# Patient Record
Sex: Female | Born: 1985 | Race: White | Hispanic: No | Marital: Married | Smoking: Former smoker
Health system: Western US, Academic
[De-identification: ages and names within clinical notes are randomized; demographics above are authoritative.]

## PROBLEM LIST (undated history)

## (undated) MED ORDER — MEPERIDINE HCL 25 MG/ML IJ SOLN
12.50 mg | INTRAMUSCULAR | Status: AC | PRN
Start: 2016-06-09 — End: ?

## (undated) MED ORDER — DIPHENHYDRAMINE HCL 50 MG/ML IJ SOLN
25.00 mg | Freq: Four times a day (QID) | INTRAMUSCULAR | Status: AC | PRN
Start: 2016-06-09 — End: ?

## (undated) MED ORDER — ONDANSETRON HCL 4 MG/2ML IV SOLN
4.00 mg | INTRAMUSCULAR | Status: AC | PRN
Start: 2016-06-09 — End: ?

## (undated) MED ORDER — KETOROLAC TROMETHAMINE 30 MG/ML IJ SOLN
30.00 mg | Freq: Once | INTRAMUSCULAR | Status: AC | PRN
Start: 2016-06-09 — End: 2016-06-10

---

## 2010-01-29 IMAGING — US US PELVIS COMPLETE MODIFY
1 series · 14 of 25 positions shown · non-contrast
Comparison: None

CLINICAL DATA: Dysfunctional uterine bleeding with left lower
quadrant pain.  LMP 07/16/2007

TRANSVAGINAL ULTRASOUND OF PELVIS,ULTRASOUND PELVIS COMPLETE -
MODIFY
TECHNIQUE: Transvaginal ultrasound examination of the pelvis was
performed including evaluation of the uterus, ovaries, adnexal
regions, and pelvic cul-de-sac,

[Series 1: us transvaginal non-ob · 14 of 38 slices shown]
[im 1/38]
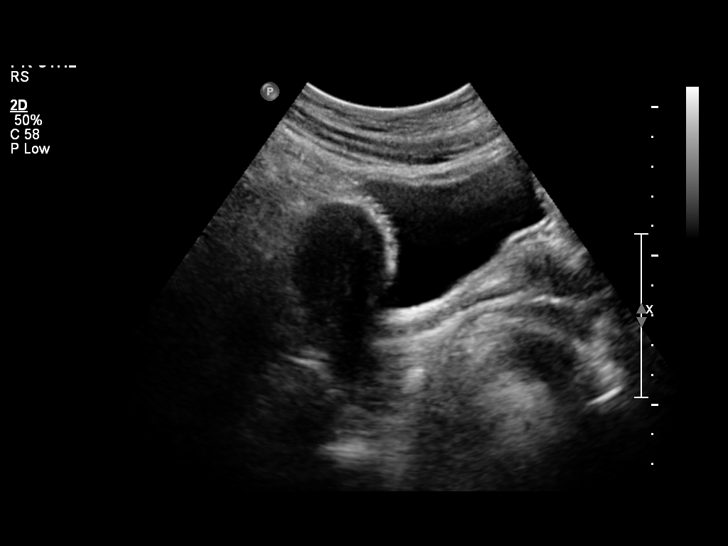
[im 4/38]
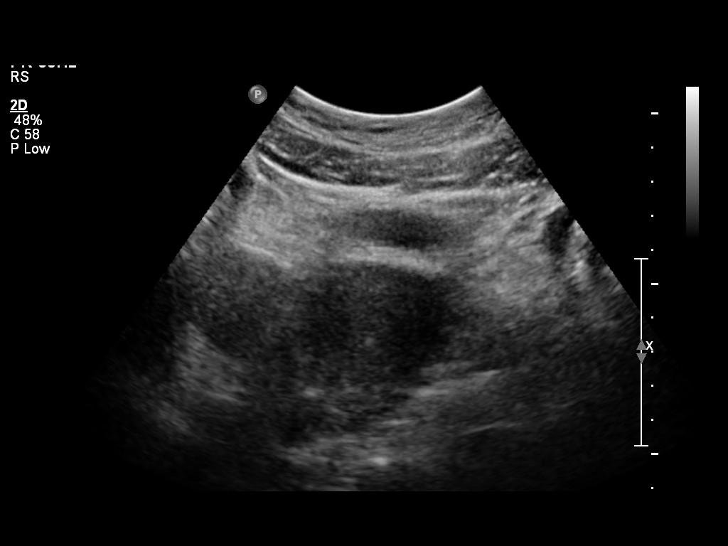
[im 7/38]
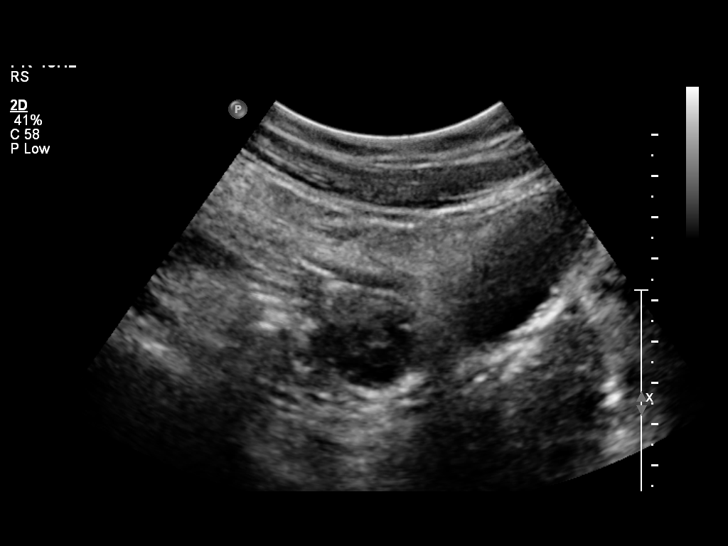
[im 10/38]
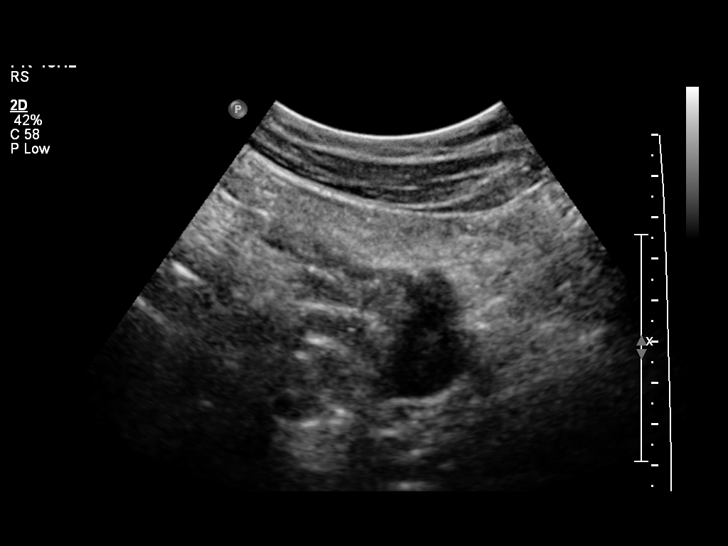
[im 13/38]
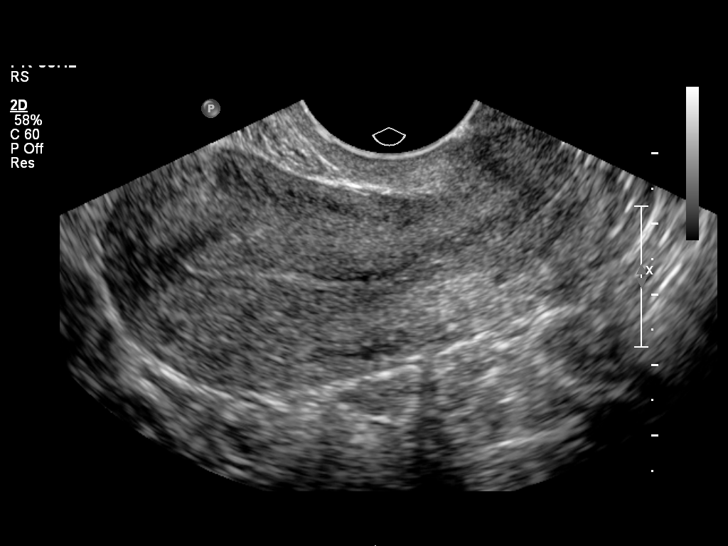
[im 14/38]
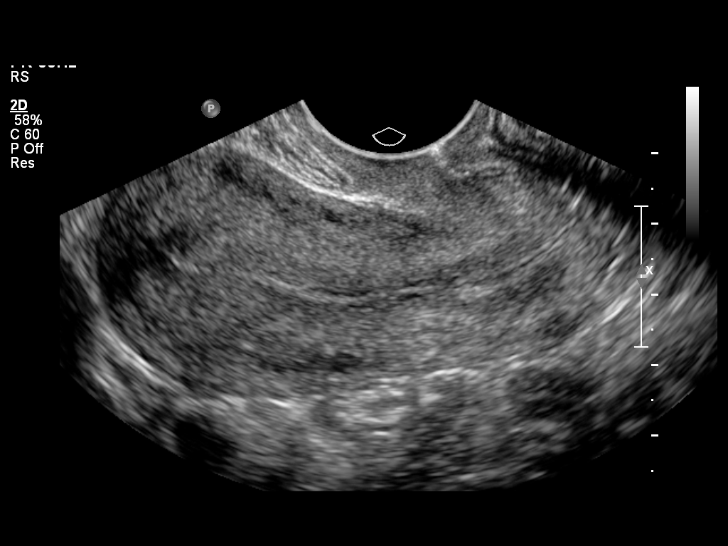
[im 17/38]
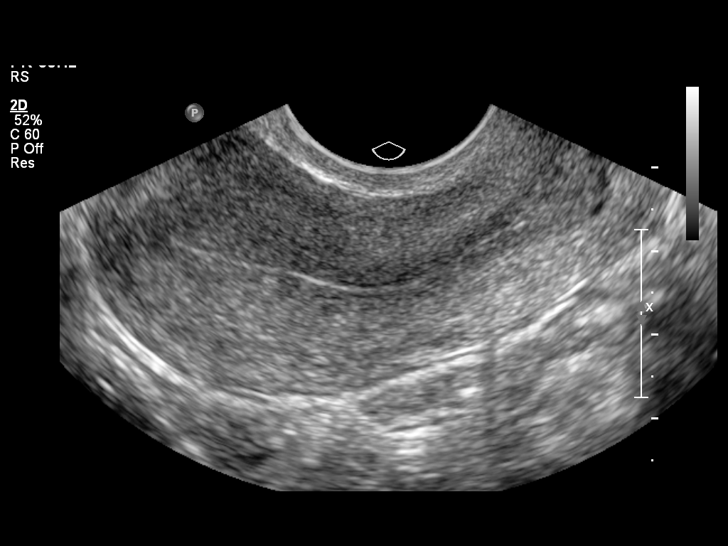
[im 21/38]
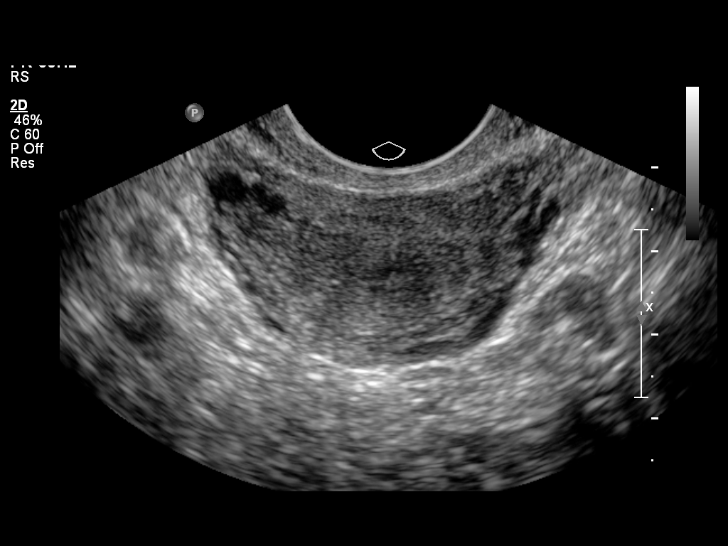
[im 24/38]
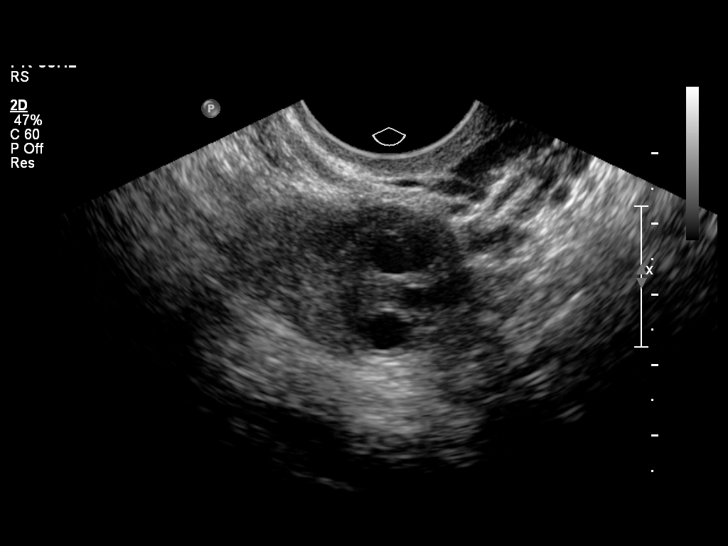
[im 25/38]
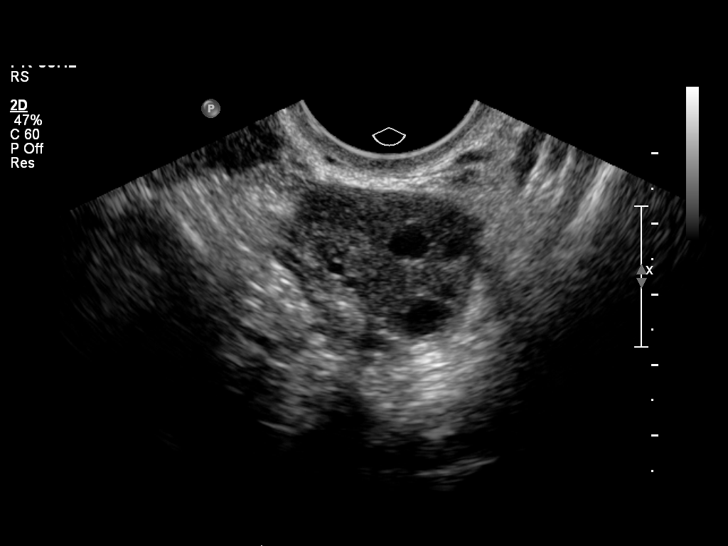
[im 28/38]
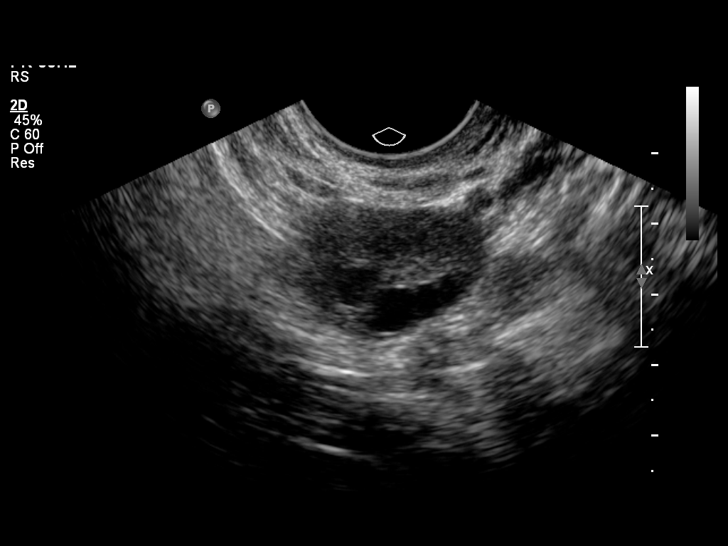
[im 31/38]
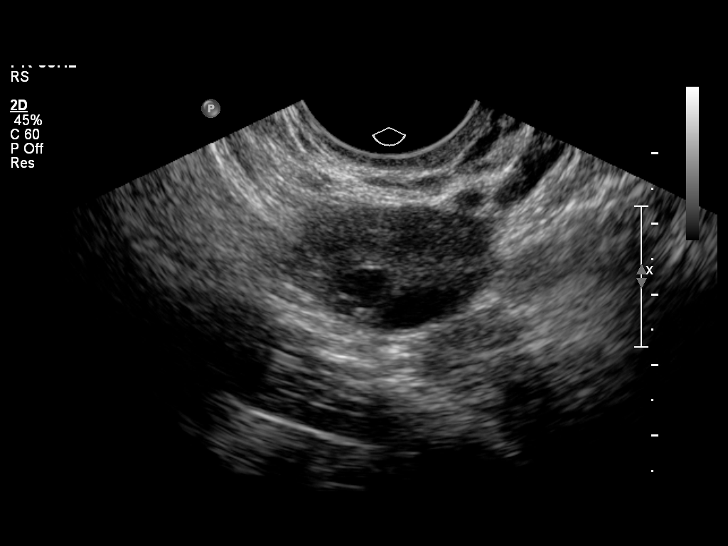
[im 34/38]
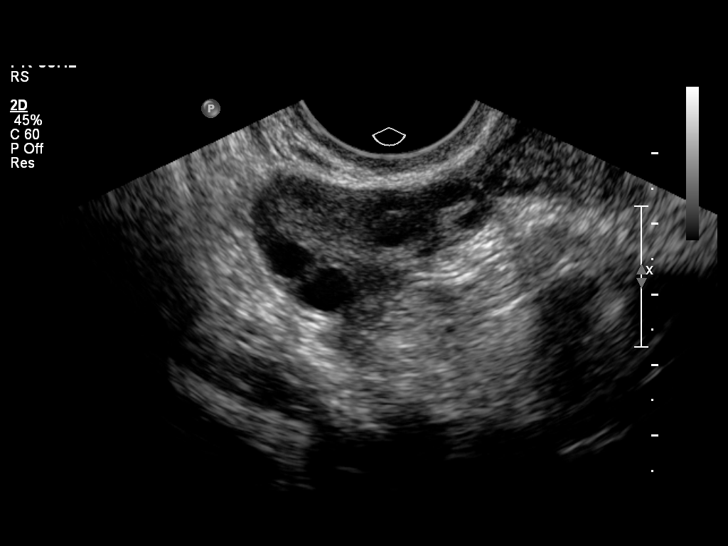
[im 38/38]
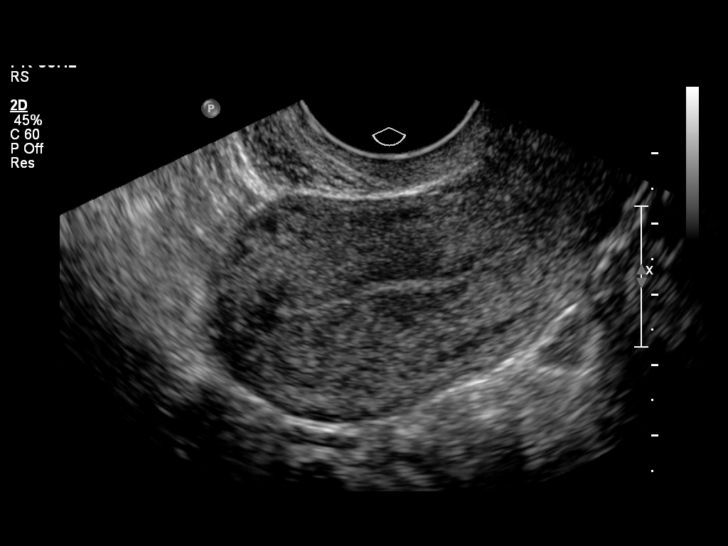

[14 of 25 positions shown; findings below may reference images not displayed]

FINDINGS: The uterus demonstrates a sagittal length of 3.8 cm, an
AP width of 3.6 cm and a transverse width of 4.9 cm.  A homogeneous
uterine myometrium is seen.  The endometrial canal is thin and
echogenic with an AP width of 3 mm.  No areas of focal thickening
or inhomogeneity are noted.

Both ovaries are seen and have a normal appearance with the left
ovary measuring 3.4 x 2.4 x 2.5 cm and the right ovary measuring
3.9 x 2.2 x 3.4 cm.  Both ovaries contain several subcentimeter
follicles.
IMPRESSION: Normal uterine myometrium, endometrium and ovaries.  The
endometrium is thin  and this does not correspond with the
patient's given LMP of 07/16/2007, but has a post secretary phase
and nonstimulated appearance. Clinical correlation is recommended.

## 2013-09-12 IMAGING — US US RENAL KIDNEY
1 series · 17 of 25 positions shown · non-contrast
Comparison: none

REASON FOR EXAM: R flank pain and vomiting
COMMENTS:

[Series 1: us renal kidney · 17 of 67 slices shown]
[im 1/67]
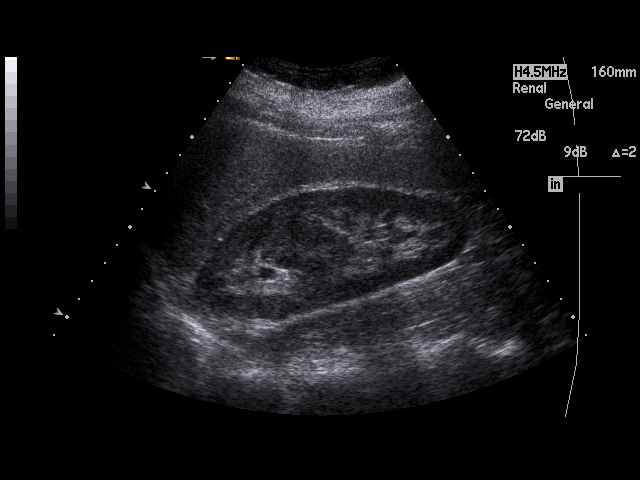
[im 6/67]
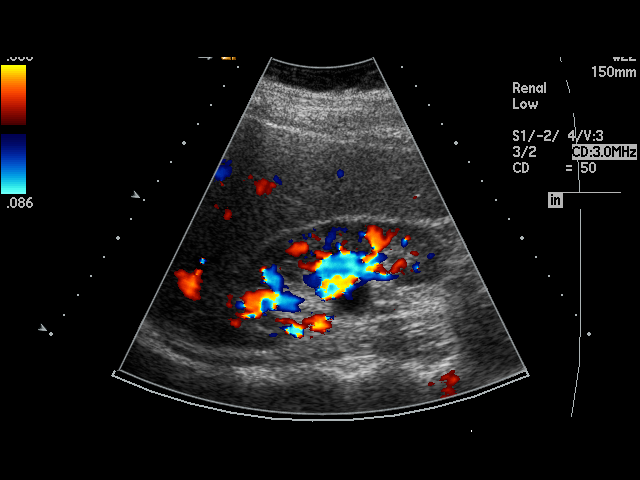
[im 9/67]
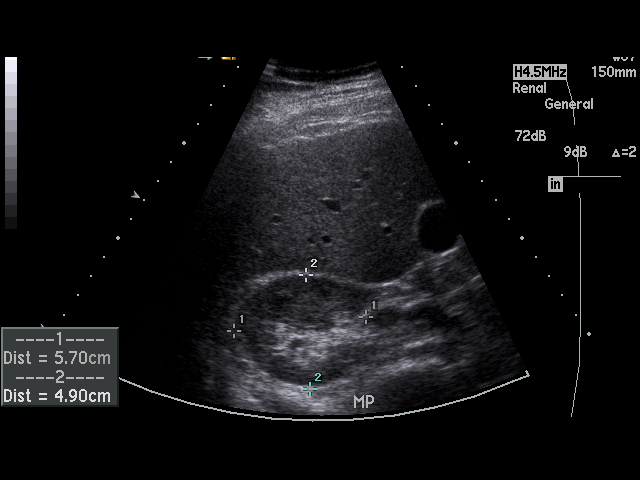
[im 14/67]
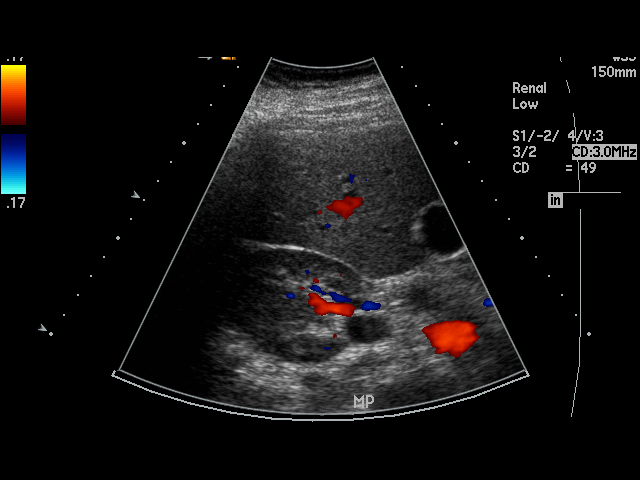
[im 17/67]
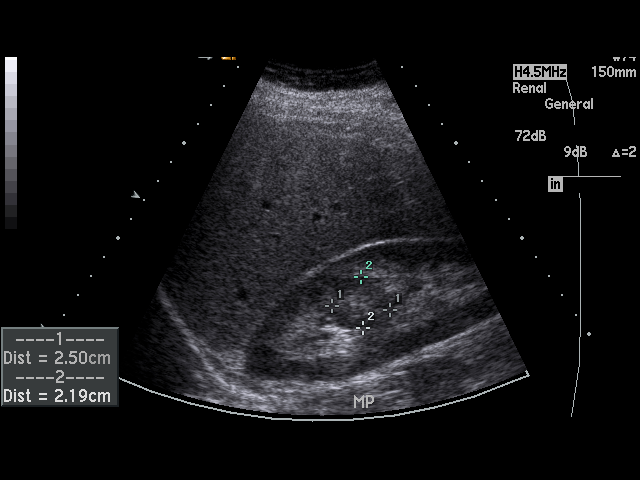
[im 23/67]
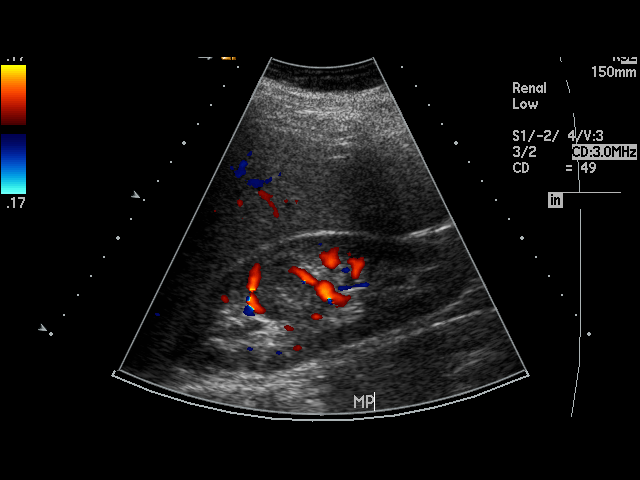
[im 25/67]
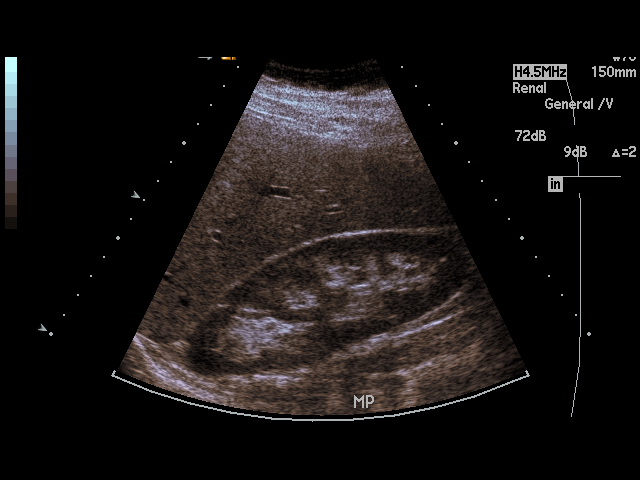
[im 31/67]
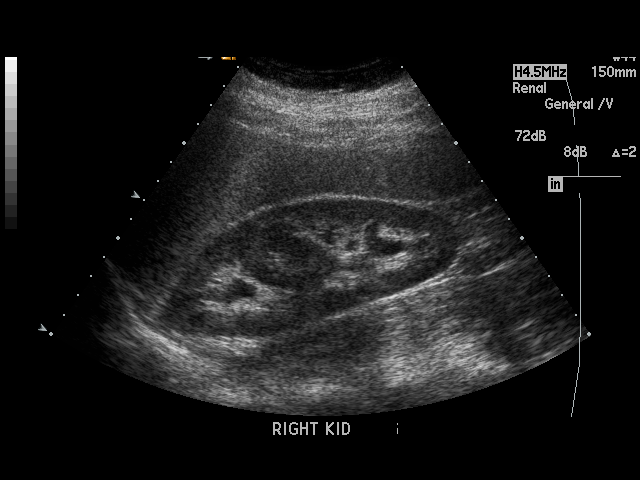
[im 34/67]
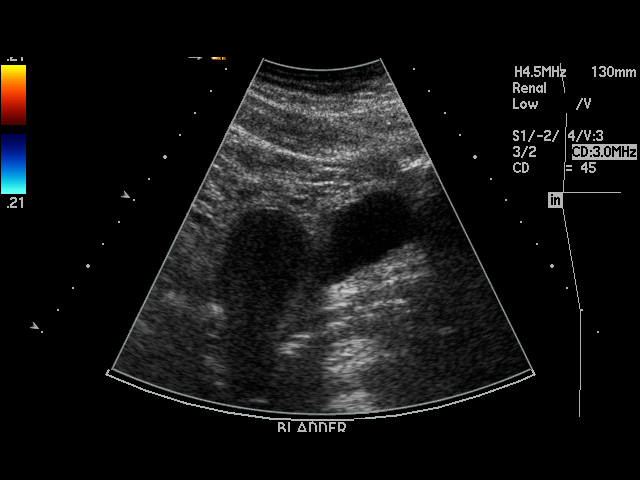
[im 36/67]
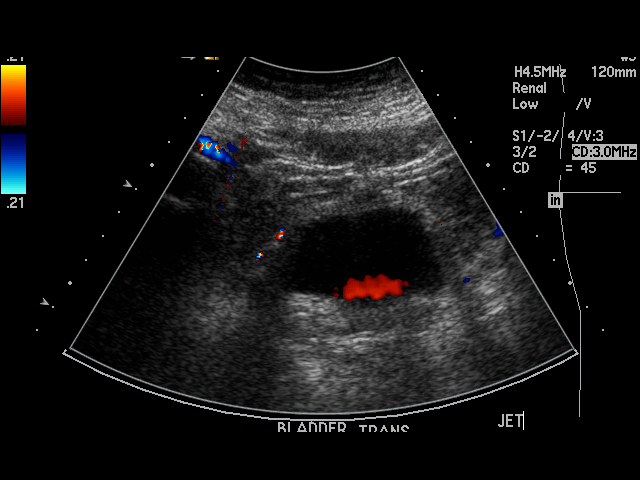
[im 42/67]
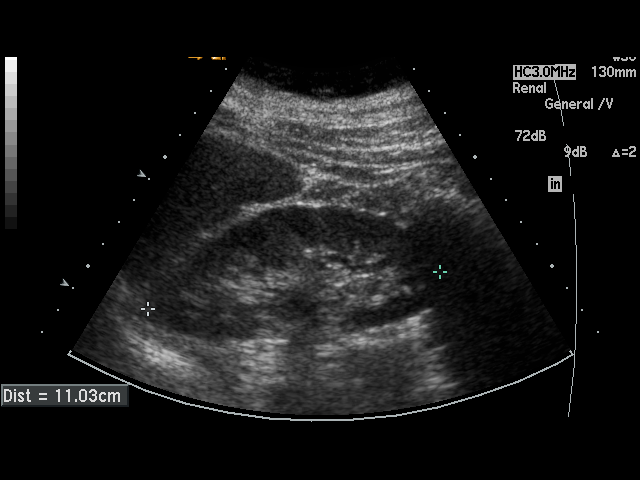
[im 45/67]
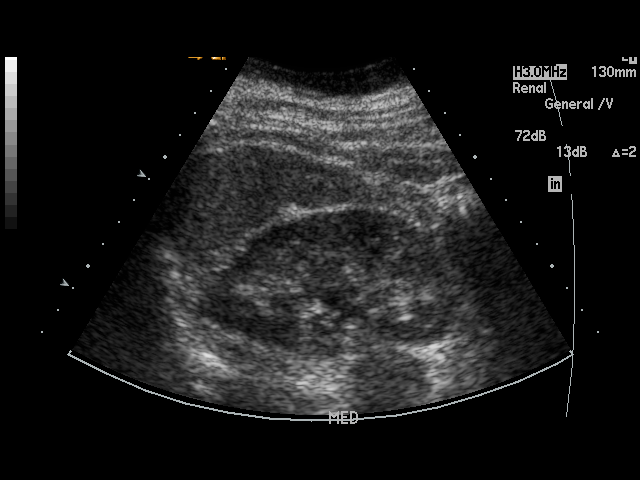
[im 50/67]
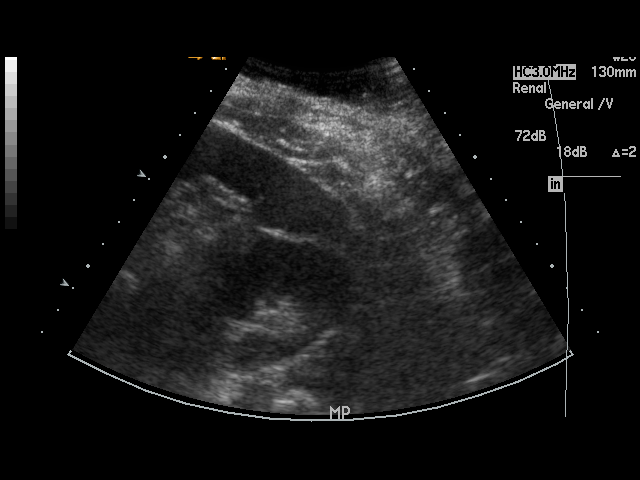
[im 53/67]
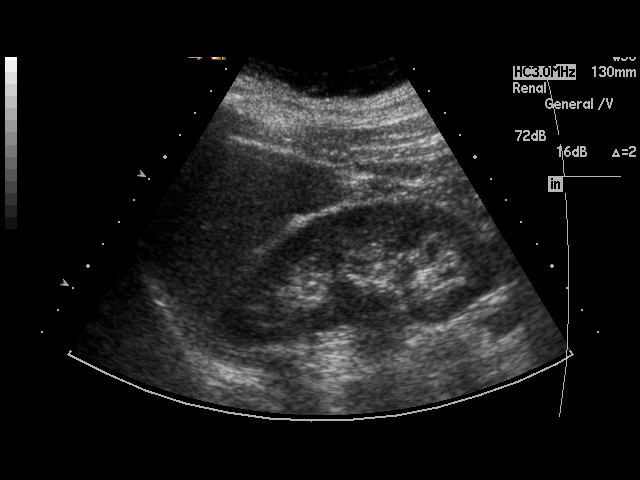
[im 58/67]
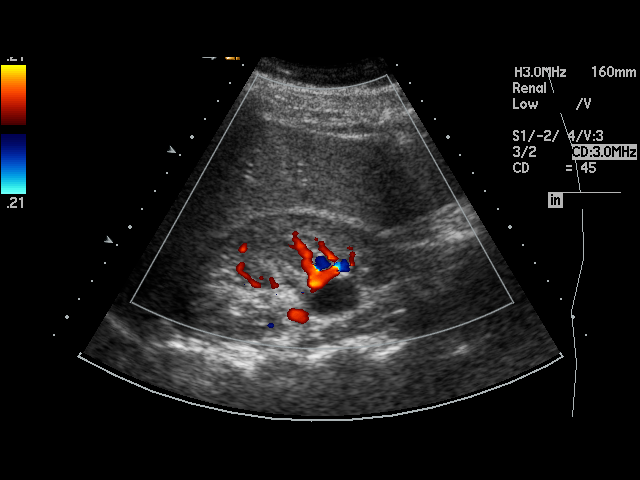
[im 61/67]
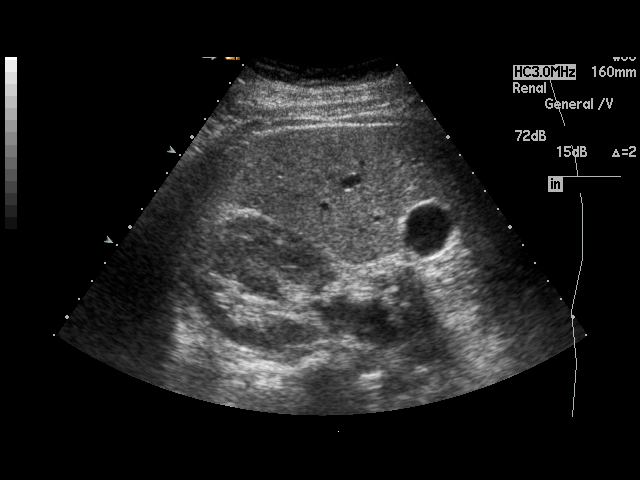
[im 67/67]
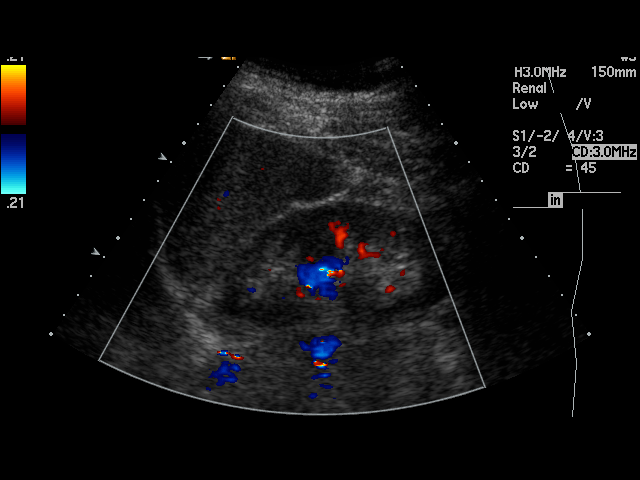

[17 of 25 positions shown; findings below may reference images not displayed]

PROCEDURE:     US  - US KIDNEY  - April 08, 2011  [DATE]

RESULT:     The right kidney measures 13 x 5.7 x 4.9 cm. The left kidney
measures 11 x 4.8 x 4.7 cm. Minimal hydronephrosis is present on the right.
A questionable hypoechoic mass versus column of Bertin on the right is
present measuring 2.9 cm in greatest dimension. The left kidney exhibits no
hydronephrosis. Bilateral ureteral jets are demonstrated. No perinephric
fluid collections are demonstrated.
IMPRESSION: 1. There is minimal hydronephrosis on the right yet a ureteral jet on the
right is demonstrated.
2. There is a hypoechoic focus in the midpole of the right kidney most
compatible with a column of Bertin though a mass cannot be dogmatically
excluded. No calcified stones are demonstrated within either kidney.
3. The left kidney is normal in appearance and a left ureteral jet is
demonstrated.

## 2015-11-13 ENCOUNTER — Ambulatory Visit: Payer: Self-pay

## 2015-11-14 ENCOUNTER — Ambulatory Visit: Payer: Self-pay | Admitting: Ob/Gyn

## 2015-11-26 ENCOUNTER — Ambulatory Visit: Payer: Self-pay | Admitting: Ob/Gyn

## 2015-11-26 LAB — URINALYSIS 8, POINT OF CARE TESTING SOM (LIO)
Glucose, UA POCT: NEGATIVE mg/dL
Leukocytes, UA POCT: NEGATIVE
Nitrite, UA POCT: NEGATIVE
Protein, UA POCT: NEGATIVE mg/dL
Specific Gravity, UA (POCT): >=1.03 (ref 1.003–1.030)
pH, UA POCT: 5.5 (ref 5.0–8.0)

## 2015-11-26 LAB — CBC WITH DIFF, BLOOD
ANC automated: 7.1 10*3/uL (ref 2.0–8.1)
Basophils %: 0.4 %
Basophils Absolute: 0 10*3/uL (ref 0.0–0.2)
Eosinophils %: 0.4 %
Eosinophils Absolute: 0 10*3/uL (ref 0.0–0.5)
Hematocrit: 40.4 % (ref 34.0–44.0)
Hgb: 13.5 G/DL (ref 11.5–15.0)
Lymphocytes %: 20 %
Lymphocytes Absolute: 1.9 10*3/uL (ref 0.9–3.3)
MCH: 28.9 PG (ref 27.0–33.5)
MCHC: 33.5 G/DL (ref 32.0–35.5)
MCV: 86.4 FL (ref 81.5–97.0)
MPV: 8.4 FL (ref 7.2–11.7)
Monocytes %: 6 %
Monocytes Absolute: 0.6 10*3/uL (ref 0.0–0.8)
Neutrophils % (A): 73.2 %
PLT Count: 279 10*3/uL (ref 150–400)
RBC: 4.68 10*6/uL (ref 3.70–5.00)
RDW-CV: 12.6 % (ref 11.6–14.4)
White Bld Cell Count: 9.6 10*3/uL (ref 4.0–10.5)

## 2015-11-26 LAB — HIV 1/2 ANTIBODY & P24 ANTIGEN ASSAY, BLOOD: HIV  1+2 Ab plus HIV 1 p24 Ant Scrn: NONREACTIVE

## 2015-11-26 LAB — URINALYSIS
Bilirubin, UA: NEGATIVE
Glucose, UA: NEGATIVE MG/DL
Hemoglobin, UA: NEGATIVE
Ketones, UA: NEGATIVE MG/DL
Leukocyte Esterase, UA: NEGATIVE
Nitrite, UA: NEGATIVE
Protein, UA: NEGATIVE MG/DL
RBC, UA: 2 #/HPF (ref 0–3)
Specific Grav, UA: 1.029 (ref 1.003–1.030)
Squamous Epithelial, UA: <1 /HPF (ref 0–10)
Urobilinogen, UA: <2 MG/DL (ref <2.0)
WBC, UA: <1 #/HPF (ref 0–5)
pH, UA: 5 (ref 5.0–8.0)

## 2015-11-26 LAB — HEPATITIS B SURFACE ANTIGEN WITH CONFIRMATION: Hepatitis B Surface Antigen: NONREACTIVE

## 2015-11-26 LAB — ABO/RH(D) + ANTIBODY SCREEN
ABO/Rh(D): O NEG
Antibody Screen Result: NEGATIVE

## 2015-11-26 LAB — RUBELLA IGG ANTIBODY, BLOOD: Rubella Virus  Ab IgG: 4

## 2015-11-26 LAB — LABCUM (HISTORIC)

## 2015-11-26 LAB — SYPHILIS EIA SCREEN, BLOOD: T. Pallidum Ab: NONREACTIVE

## 2015-11-27 LAB — URINE CULTURE
Culture Result: 1
Culture Result: 10000

## 2015-12-05 ENCOUNTER — Ambulatory Visit: Payer: Self-pay | Admitting: Ob/Gyn

## 2015-12-17 ENCOUNTER — Ambulatory Visit: Payer: Self-pay | Admitting: Ob/Gyn

## 2015-12-17 LAB — URINALYSIS 8, POINT OF CARE TESTING SOM (LIO)
Glucose, UA POCT: NEGATIVE mg/dL
Ketone, UA POCT: NEGATIVE mg/dL
Leukocytes, UA POCT: NEGATIVE
Nitrite, UA POCT: NEGATIVE
Protein, UA POCT: NEGATIVE mg/dL
Specific Gravity, UA (POCT): >=1.03 (ref 1.003–1.030)
pH, UA POCT: 6 (ref 5.0–8.0)

## 2016-01-07 ENCOUNTER — Ambulatory Visit: Payer: Self-pay | Admitting: Maternal & Fetal Medicine

## 2016-01-07 LAB — URINALYSIS 8, POINT OF CARE TESTING SOM (LIO)
Glucose, UA POCT: NEGATIVE mg/dL
Ketone, UA POCT: NEGATIVE mg/dL
Nitrite, UA POCT: NEGATIVE
Protein, UA POCT: NEGATIVE mg/dL
Specific Gravity, UA (POCT): >=1.03 (ref 1.003–1.030)
pH, UA POCT: 5 (ref 5.0–8.0)

## 2016-01-21 LAB — URINALYSIS 8, POINT OF CARE TESTING SOM (LIO)
Bld, UA POCT: NEGATIVE
Glucose, UA POCT: NEGATIVE mg/dL
Leukocytes, UA POCT: NEGATIVE
Nitrite, UA POCT: NEGATIVE
Protein, UA POCT: NEGATIVE mg/dL
Specific Gravity, UA (POCT): >=1.03 (ref 1.003–1.030)
pH, UA POCT: 5.5 (ref 5.0–8.0)

## 2016-01-22 ENCOUNTER — Ambulatory Visit: Payer: Self-pay | Admitting: Ob/Gyn

## 2016-01-22 ENCOUNTER — Ambulatory Visit: Payer: Self-pay | Admitting: Maternal & Fetal Medicine

## 2016-01-22 ENCOUNTER — Ambulatory Visit: Payer: Self-pay | Admitting: Cardiovascular Disease

## 2016-01-27 ENCOUNTER — Ambulatory Visit: Payer: Self-pay

## 2016-02-13 ENCOUNTER — Ambulatory Visit: Payer: Self-pay | Admitting: Ob/Gyn

## 2016-02-18 LAB — URINALYSIS 8, POINT OF CARE TESTING SOM (LIO)
Glucose, UA POCT: NEGATIVE mg/dL
Ketone, UA POCT: NEGATIVE mg/dL
Nitrite, UA POCT: NEGATIVE
Protein, UA POCT: NEGATIVE mg/dL
Specific Gravity, UA (POCT): 1.025 (ref 1.003–1.030)
pH, UA POCT: 6 (ref 5.0–8.0)

## 2016-03-04 ENCOUNTER — Ambulatory Visit (INDEPENDENT_AMBULATORY_CARE_PROVIDER_SITE_OTHER): Payer: Commercial Managed Care - PPO

## 2016-03-04 DIAGNOSIS — Z3689 Encounter for other specified antenatal screening: Secondary | ICD-10-CM

## 2016-03-04 DIAGNOSIS — O09292 Supervision of pregnancy with other poor reproductive or obstetric history, second trimester: Secondary | ICD-10-CM

## 2016-03-04 DIAGNOSIS — Z006 Encounter for examination for normal comparison and control in clinical research program: Secondary | ICD-10-CM

## 2016-03-04 LAB — HEMOGRAM, BLOOD
Hematocrit: 38.1 % (ref 34.0–44.0)
Hgb: 12.6 G/DL (ref 11.5–15.0)
MCH: 29.9 PG (ref 27.0–33.5)
MCHC: 33.1 G/DL (ref 32.0–35.5)
MCV: 90.3 FL (ref 81.5–97.0)
MPV: 9.2 FL (ref 7.2–11.7)
PLT Count: 234 10*3/uL (ref 150–400)
RBC: 4.22 10*6/uL (ref 3.70–5.00)
RDW-CV: 12.8 % (ref 11.6–14.4)
White Bld Cell Count: 9.2 10*3/uL (ref 4.0–10.5)

## 2016-03-04 LAB — GLUCOSE TOLERANCE 1 HR POST GLUCOLA (OB ONLY): Glucose 1 Hr: 89 MG/DL (ref <140)

## 2016-03-04 NOTE — Interdisciplinary (Signed)
Pt here for one hour gtt and cbc . 50 g glucola given ,. Blood drawn in one hour post glucola. Tolerated well. Will f/u w results

## 2016-03-05 ENCOUNTER — Telehealth: Payer: Self-pay | Admitting: Registered Nurse

## 2016-03-05 NOTE — Telephone Encounter (Signed)
Pt informed 1 hr gtt and CBC is normal.    Renita PapaNina Pung, NP

## 2016-03-17 ENCOUNTER — Other Ambulatory Visit: Payer: Commercial Managed Care - PPO

## 2016-03-17 ENCOUNTER — Ambulatory Visit: Payer: Commercial Managed Care - PPO | Admitting: Ob/Gyn

## 2016-03-24 ENCOUNTER — Encounter: Payer: Self-pay | Admitting: Ob/Gyn

## 2016-03-24 ENCOUNTER — Ambulatory Visit (INDEPENDENT_AMBULATORY_CARE_PROVIDER_SITE_OTHER): Payer: Commercial Managed Care - PPO

## 2016-03-24 ENCOUNTER — Ambulatory Visit (INDEPENDENT_AMBULATORY_CARE_PROVIDER_SITE_OTHER): Payer: Commercial Managed Care - PPO | Admitting: Ob/Gyn

## 2016-03-24 VITALS — BP 117/78 | HR 81 | Temp 97.4°F | Ht 67.2 in | Wt 205.0 lb

## 2016-03-24 DIAGNOSIS — O3403 Maternal care for unspecified congenital malformation of uterus, third trimester: Secondary | ICD-10-CM

## 2016-03-24 DIAGNOSIS — Q513 Bicornate uterus: Secondary | ICD-10-CM

## 2016-03-24 DIAGNOSIS — Z364 Encounter for antenatal screening for fetal growth retardation: Principal | ICD-10-CM

## 2016-03-24 DIAGNOSIS — Z6791 Unspecified blood type, Rh negative: Secondary | ICD-10-CM | POA: Insufficient documentation

## 2016-03-24 DIAGNOSIS — Z006 Encounter for examination for normal comparison and control in clinical research program: Secondary | ICD-10-CM

## 2016-03-24 DIAGNOSIS — O3400 Maternal care for unspecified congenital malformation of uterus, unspecified trimester: Secondary | ICD-10-CM

## 2016-03-24 DIAGNOSIS — Z98891 History of uterine scar from previous surgery: Secondary | ICD-10-CM | POA: Insufficient documentation

## 2016-03-24 DIAGNOSIS — Z8759 Personal history of other complications of pregnancy, childbirth and the puerperium: Secondary | ICD-10-CM | POA: Insufficient documentation

## 2016-03-24 DIAGNOSIS — O26899 Other specified pregnancy related conditions, unspecified trimester: Secondary | ICD-10-CM | POA: Insufficient documentation

## 2016-03-24 DIAGNOSIS — O0993 Supervision of high risk pregnancy, unspecified, third trimester: Secondary | ICD-10-CM | POA: Insufficient documentation

## 2016-03-24 DIAGNOSIS — Z3A28 28 weeks gestation of pregnancy: Secondary | ICD-10-CM

## 2016-03-24 DIAGNOSIS — O09899 Supervision of other high risk pregnancies, unspecified trimester: Secondary | ICD-10-CM

## 2016-03-24 LAB — URINALYSIS 8, POINT OF CARE TESTING SOM (LIO)
Glucose, UA POCT: NEGATIVE mg/dL
Ketone, UA POCT: NEGATIVE mg/dL
Nitrite, UA POCT: NEGATIVE
Protein, UA POCT: NEGATIVE mg/dL
Specific Gravity, UA (POCT): >=1.03 (ref 1.003–1.030)
pH, UA POCT: 6 (ref 5.0–8.0)

## 2016-03-24 MED ORDER — ASPIRIN 81 MG OR TABS
81.00 mg | ORAL_TABLET | Freq: Every day | ORAL | Status: DC
Start: ? — End: 2016-06-12

## 2016-03-24 MED ORDER — PRENATAL GUMMIES/DHA & FA 0.4-32.5 MG PO CHEW
CHEWABLE_TABLET | ORAL | Status: DC
Start: ? — End: 2016-06-12

## 2016-03-24 MED ORDER — RHO D IMMUNE GLOBULIN 1500 UNITS IM SOSY
300.00 ug | PREFILLED_SYRINGE | Freq: Once | INTRAMUSCULAR | Status: AC
Start: 2016-03-24 — End: 2016-03-25

## 2016-03-24 NOTE — Progress Notes (Signed)
ROB visit  1812w0d    -Declined flu and tdap vaccines. Received tdap after last baby. Will have family members vaccinated.    Rh negative state in antepartum period  Rhogam given today    History of pregnancy induced hypertension  Continue ASA 81 mg    Bicornuate uterus  Growth US today, 03/24/16, S=D. Repeat in 4 weeks    Renita PapaNina Pung, NP  Supervising Physician: Dr. Kelby FamMajor

## 2016-03-24 NOTE — Patient Instructions (Signed)
Schedule rob in 2 weeks

## 2016-03-24 NOTE — Assessment & Plan Note (Signed)
Continue ASA 81mg

## 2016-03-24 NOTE — Interdisciplinary (Signed)
28 wks IUP, with RH neg status in pregnancy. Rhogam administered IM R gluteal per MD order without complication. Lot [id number]  Exp 01/12/2018  NDC [id number]

## 2016-03-24 NOTE — Assessment & Plan Note (Signed)
Rhogam given today

## 2016-03-24 NOTE — Assessment & Plan Note (Signed)
Growth US today, 03/24/16, S=D. Repeat in 4 weeks

## 2016-03-24 NOTE — Procedures (Signed)
2ND/3RD TRIMESTER                     Mar 24 2016                     RE: Erin Harrington                                  Physician: Maxwell Caularol   Major,       M.D.       MR#: [id number]                                      [address]       DOB: Jan 22 1986                                   [address]       Visit#: [id number]                               [place], [place]  [id number]       (Exam #: [id number])                           Fax:              The LMP of this 30 year old, gravida 2, para 1 patient was Sep 15 2015,       her working EDD is Jun 16 2016 and the current gestational age is 28   weeks       0 days by [employer]. A sonographic examination was performed on Mar 24 2016       using real time equipment.              Multiple longitudinal and transverse sections revealed a singleton       intrauterine pregnancy with the fetus in breech presentation. The   placenta       is right lateral in implantation, grade I in appearance, and there is   no       placenta previa.              INDICATIONS              Maternal care for congenital malformation of uterus  [O340]       Personal history of other complications of pregnancy, childbirth and   the       puerperium  [Z8759]       Encounter for antenatal screening of mother  [Z36]       Maternal care for scar from previous cesarean delivery  [O3421]       Encounter for antenatal screening for fetal growth retardation  [Z364]              Exam Types              Complete Fetal Survey [phone](76805) (Quantity: 1)              MEASUREMENTS              BPD  7.1 cm        28 weeks 4 days* (49%)       HC              26.3 cm        28 weeks 3 days* (44%)       AC              24.2 cm        28 weeks 3 days* (54%)       Femur            5.4 cm        28 weeks 4 days* (48%)       Humerus          4.9 cm        28 weeks 6 days  (60%)       Cerebellum       3.3 cm        29 weeks 4 days              OFD              9.4 cm              HC/AC           1.09        FL/AC           0.22       FL/BPD          0.76       Ceph Index      0.76       EFW (Ac/Fl/Hc)  1242 grams - 2 lbs 12 oz                 (57%)              THE AVERAGE GESTATIONAL AGE is 28 weeks 4 days +/- 18 days.              ANATOMY COMMENTS              The anatomic survey was limited by the late gestational age and fetal       position.  The intracranial contents appeared within normal limits.    The       cardiac views are limited but appear within normal limits.  The   kidneys,       bladder and stomach bubble were visualized.  The spine appears grossly       intact.  The extremities appear normal.  The placenta and the cord   appear       within normal limits without obvious abnormalities.              UTERUS              The uterus was visualized, midplane in orientation.              ADNEXA              The left ovary was not visualized. The right ovary was not visualized.              AMNIOTIC FLUID              Q1: 4.3      Q2: 2.7      Q3: 2.8      Q4: 4.3       AFI Total = 14.1 cm  Amniotic Fluid: Normal              IMPRESSION              Singleton IUP       28 weeks and 4 days by this ultrasound. (EDD=Jun 12 2016)       Breech presentation       Fetal growth appeared normal       Regular fetal heart rate of 161 bpm       Right lateral placenta       No placenta previa              GENERAL COMMENT              The patient presents for fetal growth evaluation with a history of a       bicornute uterus.              The measurements are consistent with the previously established       gestational age.              Visualization of the fetal anatomy was limited due to the advancing       gestational age of the fetus.  No obvious congenital malformations   were       observed.              Recommend a growth scan in 4 weeks.              Leavy CellaJulianne Toohey, M.D.       Professor       Electronically signed 03/24/16 16:47

## 2016-04-01 ENCOUNTER — Telehealth: Payer: Self-pay | Admitting: Ob/Gyn

## 2016-04-01 NOTE — Telephone Encounter (Signed)
Patient requesting a call back from office concerning if it would be ok for her use a topical ointment for muscle relaxer or pain since she is pregnant. To please call her back for assistance. Thank you

## 2016-04-01 NOTE — Telephone Encounter (Signed)
PER DR. MAJOR OK TO USE OTC CREAM FOR NECK, EXTRA STRENGTH TYLENOL AS NEEDED  WILL CALL IF SYMPTOMS WORSEN OR CHANGE. VERBALIZED UNDERSTANDING.

## 2016-04-07 ENCOUNTER — Encounter: Payer: Self-pay | Admitting: Ob/Gyn

## 2016-04-07 ENCOUNTER — Ambulatory Visit (INDEPENDENT_AMBULATORY_CARE_PROVIDER_SITE_OTHER): Payer: Commercial Managed Care - PPO | Admitting: Ob/Gyn

## 2016-04-07 VITALS — BP 119/79 | HR 81 | Ht 67.0 in | Wt 207.2 lb

## 2016-04-07 DIAGNOSIS — Z6791 Unspecified blood type, Rh negative: Secondary | ICD-10-CM

## 2016-04-07 DIAGNOSIS — Z8759 Personal history of other complications of pregnancy, childbirth and the puerperium: Secondary | ICD-10-CM

## 2016-04-07 DIAGNOSIS — O3403 Maternal care for unspecified congenital malformation of uterus, third trimester: Secondary | ICD-10-CM

## 2016-04-07 DIAGNOSIS — O34219 Maternal care for unspecified type scar from previous cesarean delivery: Secondary | ICD-10-CM

## 2016-04-07 DIAGNOSIS — Z98891 History of uterine scar from previous surgery: Secondary | ICD-10-CM

## 2016-04-07 DIAGNOSIS — Q513 Bicornate uterus: Secondary | ICD-10-CM

## 2016-04-07 DIAGNOSIS — Z3A30 30 weeks gestation of pregnancy: Secondary | ICD-10-CM

## 2016-04-07 DIAGNOSIS — O0993 Supervision of high risk pregnancy, unspecified, third trimester: Secondary | ICD-10-CM

## 2016-04-07 LAB — URINALYSIS 8, POINT OF CARE TESTING SOM (LIO)
Bld, UA POCT: NEGATIVE
Glucose, UA POCT: NEGATIVE mg/dL
Ketone, UA POCT: NEGATIVE mg/dL
Nitrite, UA POCT: NEGATIVE
Protein, UA POCT: NEGATIVE mg/dL
Specific Gravity, UA (POCT): 1.025 (ref 1.003–1.030)
pH, UA POCT: 6 (ref 5.0–8.0)

## 2016-04-07 NOTE — Assessment & Plan Note (Signed)
Continue ASA 81 mg daily  C/o swelling in hands. Patient denies HA, vision changes, N/V, epigastric pain. BP 119/79

## 2016-04-07 NOTE — Assessment & Plan Note (Signed)
Repeat growth US on 04/22/16.

## 2016-04-07 NOTE — Progress Notes (Signed)
ROB visit  G2P1001 4119w0d    Denies abdominal pain, vaginal bleeding, leakage of fluid. Reports normal fetal movements.    Bicornuate uterus  Repeat growth US on 04/22/16.    History of pregnancy induced hypertension  Continue ASA 81 mg daily  C/o swelling in hands. Patient denies HA, vision changes, N/V, epigastric pain. BP 119/79     Renita PapaNina Pung, NP  Supervising Physician: Dr. Kelby FamMajor

## 2016-04-22 ENCOUNTER — Ambulatory Visit (INDEPENDENT_AMBULATORY_CARE_PROVIDER_SITE_OTHER): Payer: Commercial Managed Care - PPO

## 2016-04-22 DIAGNOSIS — O3403 Maternal care for unspecified congenital malformation of uterus, third trimester: Secondary | ICD-10-CM

## 2016-04-22 DIAGNOSIS — Z3A32 32 weeks gestation of pregnancy: Secondary | ICD-10-CM

## 2016-04-22 DIAGNOSIS — O0993 Supervision of high risk pregnancy, unspecified, third trimester: Secondary | ICD-10-CM

## 2016-04-22 DIAGNOSIS — Z6791 Unspecified blood type, Rh negative: Secondary | ICD-10-CM

## 2016-04-22 DIAGNOSIS — O3400 Maternal care for unspecified congenital malformation of uterus, unspecified trimester: Secondary | ICD-10-CM

## 2016-04-22 DIAGNOSIS — Z98891 History of uterine scar from previous surgery: Secondary | ICD-10-CM

## 2016-04-22 DIAGNOSIS — Z364 Encounter for antenatal screening for fetal growth retardation: Principal | ICD-10-CM

## 2016-04-22 NOTE — Procedures (Signed)
2ND/3RD TRIMESTER                     Apr 22 2016                     RE: Erin Harrington                                  Physician: Maxwell Caularol   Major,       M.D.       MR#: [id number]                                      [address]       DOB: Jan 22 1986                                   [address]       Visit#: [id number]                               [place], [place]  [phone]       (Exam #: [id number])                           Fax:              The LMP of this 31 year old, gravida 2, para 1 patient was Sep 15 2015,       her working EDD is Jun 16 2016 and the current gestational age is 32   weeks       1 day by [employer]. A sonographic examination was performed on Apr 22 2016   using       real time equipment.              Multiple longitudinal and transverse sections revealed a singleton       intrauterine pregnancy with the fetus in breech presentation. The   placenta       is right lateral in implantation, grade I in appearance, and there is   no       placenta previa.              INDICATIONS              Maternal care for congenital malformation of uterus  [O340]       Personal history of other complications of pregnancy, childbirth and   the       puerperium  [Z8759]       Encounter for antenatal screening of mother  [Z36]       Maternal care for scar from previous cesarean delivery  [O3421]       Encounter for antenatal screening for fetal growth retardation  [Z364]              Exam Types              Complete Fetal Survey [phone](76805) (Quantity: 1)              MEASUREMENTS              BPD  8.0 cm        32 weeks 0 days* (40%)       HC              29.9 cm        32 weeks 5 days* (45%)       AC              28.8 cm        32 weeks 6 days* (62%)       Femur            6.1 cm        31 weeks 3 days* (39%)       Humerus          5.5 cm        32 weeks 1 day   (53%)       Cerebellum       4.2 cm        34 weeks 3 days              OFD             10.7 cm              HC/AC           1.04        FL/AC           0.21       FL/BPD          0.76       Ceph Index      0.74       EFW (Ac/Fl/Hc)  1978 grams - 4 lbs 6 oz                 (53%)              THE AVERAGE GESTATIONAL AGE is 32 weeks 2 days +/- 18 days.              ANATOMY COMMENTS              The anatomic survey was limited by the late gestational age and fetal       position.  The intracranial contents appeared within normal limits.    The       cardiac views are limited but appear within normal limits.  The   kidneys,       bladder and stomach bubble were visualized.  The spine appears grossly       intact.  The extremities appear normal.  The placenta and the cord   appear       within normal limits without obvious abnormalities.              UTERUS              The uterus was visualized, midplane in orientation.              AMNIOTIC FLUID              Q1: 2.6      Q2: 2.8      Q3: 1.3      Q4: 3.6       AFI Total = 10.3 cm       Amniotic Fluid: Normal              IMPRESSION              Singleton IUP  32 weeks and 2 days by this ultrasound. (EDD=Jun 15 2016)       Breech presentation       Fetal growth appeared normal       Regular fetal heart rate of 161 bpm       Right lateral placenta       No placenta previa              GENERAL COMMENT              The patient presents for fetal growth evaluation with a history of a       bicornuate uterus.              The measurements are consistent with the previously established       gestational age.              Visualization of the fetal anatomy was limited due to the advancing       gestational age of the fetus.  No obvious congenital malformations   were       observed.              Recommend a growth scan in 4 weeks.              Shona Simpson, M.D.       Associate Professor; [phone]       Electronically signed 04/22/16 11:40

## 2016-04-28 ENCOUNTER — Encounter: Payer: Self-pay | Admitting: Ob/Gyn

## 2016-04-28 ENCOUNTER — Ambulatory Visit (INDEPENDENT_AMBULATORY_CARE_PROVIDER_SITE_OTHER): Payer: Commercial Managed Care - PPO | Admitting: Ob/Gyn

## 2016-04-28 ENCOUNTER — Ambulatory Visit: Payer: Commercial Managed Care - PPO | Admitting: Ob/Gyn

## 2016-04-28 VITALS — BP 123/77 | HR 102 | Temp 97.2°F | Resp 18 | Ht 67.0 in | Wt 213.4 lb

## 2016-04-28 DIAGNOSIS — O34219 Maternal care for unspecified type scar from previous cesarean delivery: Secondary | ICD-10-CM

## 2016-04-28 DIAGNOSIS — Z3A33 33 weeks gestation of pregnancy: Secondary | ICD-10-CM

## 2016-04-28 DIAGNOSIS — Z6791 Unspecified blood type, Rh negative: Secondary | ICD-10-CM

## 2016-04-28 DIAGNOSIS — O09899 Supervision of other high risk pregnancies, unspecified trimester: Secondary | ICD-10-CM

## 2016-04-28 DIAGNOSIS — O3403 Maternal care for unspecified congenital malformation of uterus, third trimester: Secondary | ICD-10-CM

## 2016-04-28 DIAGNOSIS — Z98891 History of uterine scar from previous surgery: Secondary | ICD-10-CM

## 2016-04-28 DIAGNOSIS — Q513 Bicornate uterus: Secondary | ICD-10-CM

## 2016-04-28 DIAGNOSIS — O0993 Supervision of high risk pregnancy, unspecified, third trimester: Secondary | ICD-10-CM

## 2016-04-28 LAB — URINALYSIS 8, POINT OF CARE TESTING SOM (LIO)
Bld, UA POCT: NEGATIVE
Glucose, UA POCT: NEGATIVE mg/dL
Ketone, UA POCT: NEGATIVE mg/dL
Nitrite, UA POCT: NEGATIVE
Protein, UA POCT: NEGATIVE mg/dL
Specific Gravity, UA (POCT): 1.015 (ref 1.003–1.030)
pH, UA POCT: 7 (ref 5.0–8.0)

## 2016-04-28 NOTE — Progress Notes (Addendum)
04/28/2016  ROB visit  31 year old G2P1001 4964w0d    -Denies abdominal pain, vaginal bleeding, leakage of fluid, preeclampsia symptoms, dysuria. Reports normal fetal movements.  -RTC 3 weeks instead of 2 weeks due to patient preference since she has ultrasound in 3 weeks. Weekly ROB visits after.    Patient Active Problem List    Diagnosis Date Noted   . Supervision of high-risk pregnancy, third trimester 03/24/2016   . Bicornuate uterus 03/24/2016     Serial growth US q4 weeks  Last growth US on 04/22/16, S=D, repeat in 3 weeks     . History of low transverse cesarean section 03/24/2016     H/o c-section x1 for breech    This fetus is breech and will likely stay breech due to bicornuate uterus. Repeat CS scheduled at 39 weeks on 06/09/16 at 9am     . Rh negative state in antepartum period 03/24/2016     S/p rhogam on 03/24/16     . History of pregnancy induced hypertension 03/24/2016     Developed at 37 weeks with prior pregnancy, had C section at 38 weeks.  Take ASA 81 mg to decrease risk         Renita PapaNina Pung, NP  Supervising Physician: Dr. Kelby FamMajor

## 2016-05-18 ENCOUNTER — Telehealth: Payer: Self-pay | Admitting: Ob/Gyn

## 2016-05-18 ENCOUNTER — Ambulatory Visit
Admission: AD | Admit: 2016-05-18 | Discharge: 2016-05-18 | Disposition: A | Discharge status: Home / Self Care | Payer: Commercial Managed Care - PPO | Source: Ambulatory Visit | Attending: Ob/Gyn | Admitting: Ob/Gyn

## 2016-05-18 DIAGNOSIS — Z3A35 35 weeks gestation of pregnancy: Secondary | ICD-10-CM | POA: Insufficient documentation

## 2016-05-18 DIAGNOSIS — R030 Elevated blood-pressure reading, without diagnosis of hypertension: Secondary | ICD-10-CM | POA: Insufficient documentation

## 2016-05-18 DIAGNOSIS — O34219 Maternal care for unspecified type scar from previous cesarean delivery: Secondary | ICD-10-CM

## 2016-05-18 DIAGNOSIS — O321XX0 Maternal care for breech presentation, not applicable or unspecified: Secondary | ICD-10-CM | POA: Insufficient documentation

## 2016-05-18 DIAGNOSIS — Q513 Bicornate uterus: Secondary | ICD-10-CM | POA: Insufficient documentation

## 2016-05-18 DIAGNOSIS — O9989 Other specified diseases and conditions complicating pregnancy, childbirth and the puerperium: Secondary | ICD-10-CM

## 2016-05-18 DIAGNOSIS — O3403 Maternal care for unspecified congenital malformation of uterus, third trimester: Secondary | ICD-10-CM | POA: Insufficient documentation

## 2016-05-18 DIAGNOSIS — O0993 Supervision of high risk pregnancy, unspecified, third trimester: Secondary | ICD-10-CM

## 2016-05-18 DIAGNOSIS — O34211 Maternal care for low transverse scar from previous cesarean delivery: Secondary | ICD-10-CM | POA: Insufficient documentation

## 2016-05-18 DIAGNOSIS — O26893 Other specified pregnancy related conditions, third trimester: Secondary | ICD-10-CM | POA: Insufficient documentation

## 2016-05-18 DIAGNOSIS — Z8759 Personal history of other complications of pregnancy, childbirth and the puerperium: Secondary | ICD-10-CM

## 2016-05-18 LAB — URINALYSIS 8, POINT OF CARE TESTING
Bld, UA POCT: NEGATIVE
Glucose, UA POCT: NEGATIVE mg/dL
Ketone, UA POCT: 15 mg/dL — AB
Nitrite, UA POCT: NEGATIVE
Protein, UA POCT: 30 mg/dL — AB
Specific Gravity, UA (POCT): >=1.03 (ref 1.003–1.030)
pH, UA POCT: 5.5 (ref 5.0–8.0)

## 2016-05-18 LAB — HEMOGRAM, BLOOD
Hematocrit: 34.4 % (ref 34.0–44.0)
Hgb: 11.4 G/DL — ABNORMAL LOW (ref 11.5–15.0)
MCH: 29 PG (ref 27.0–33.5)
MCHC: 33.2 G/DL (ref 32.0–35.5)
MCV: 87.4 FL (ref 81.5–97.0)
MPV: 9.3 FL (ref 7.2–11.7)
PLT Count: 220 10*3/uL (ref 150–400)
RBC: 3.94 10*6/uL (ref 3.70–5.00)
RDW-CV: 13.3 % (ref 11.6–14.4)
White Bld Cell Count: 11.4 10*3/uL — ABNORMAL HIGH (ref 4.0–10.5)

## 2016-05-18 LAB — ALT (SGPT), BLOOD: ALT: 9 U/L (ref 7–52)

## 2016-05-18 LAB — CREATININE W/GFR
Creat: 0.5 mg/dL — ABNORMAL LOW (ref 0.6–1.2)
eGFR - high estimate: >60 (ref >59)
eGFR - low estimate: >60 (ref >59)

## 2016-05-18 LAB — RANDOM URINE TOTAL PROTEIN: Protein, Urine-Random: 23 MG/DL

## 2016-05-18 LAB — AST (SGOT), BLOOD: AST: 14 U/L (ref 13–39)

## 2016-05-18 LAB — URIC ACID, BLOOD: Uric Acid: 4.4 MG/DL (ref 2.3–6.6)

## 2016-05-18 LAB — RANDOM URINE CREATININE: Creatinine Urine-Random: 193.6 MG/DL

## 2016-05-18 NOTE — Discharge Instructions (Signed)
Strict return precautions given for painful contractions, vaginal bleeding, leakage of fluid, decreased fetal movement, fevers, chills, nausea, vomiting, RUQ pain, change in vision, or persistent headache

## 2016-05-18 NOTE — Telephone Encounter (Signed)
Erin Harrington is 4956w6d. She was feeling dizzy, so she went to pharmacy to check BP, which was 157/103. Checked it twice and it was similar. She had headache and nausea last night, resolved today. She feels swollen all over, including hands, face, legs. She had h/o PIH last pregnancy at 37 weeks. Advised to go to L&D to r/o preeclampsia.    Renita PapaNina Pung, NP

## 2016-05-18 NOTE — Progress Notes (Signed)
CC: New OB    HPI: 31 year old G2P1001 9834w6d by 8wk sono redate presenting for r/o preE.     Reports [facility] bc she was feeling lightheaded and it was 157/103. She no longer feels lightheaded and currently does not have a HA but did last night which resolved spontaneously. Otherwise patient denies VB, LOF, contractions, dysuria, visual disturbances, RUQ pain, severe edema or f/c/n/v.       PNC: Dr. Major     Patient Active Problem List    Diagnosis Date Noted    Supervision of high-risk pregnancy, third trimester 03/24/2016     - Dating: 8 wk sono redate (EDD 06/16/16)       Bicornuate uterus 03/24/2016     Serial growth US q4 weeks  Last growth US on 04/22/16, S=D, repeat in 3 weeks      History of low transverse cesarean section 03/24/2016     H/o c-section x1 for breech    This fetus is breech and will likely stay breech due to bicornuate uterus. Repeat CS scheduled at 39 weeks on 06/09/16 at 9am      Rh negative state in antepartum period 03/24/2016     S/p rhogam on 03/24/16      History of pregnancy induced hypertension 03/24/2016     Developed at 37 weeks with prior pregnancy, had C section at 38 weeks.  Take ASA 81 mg to decrease risk         Patient Active Problem List   Diagnosis    Supervision of high-risk pregnancy, third trimester    Bicornuate uterus    History of low transverse cesarean section    Rh negative state in antepartum period    History of pregnancy induced hypertension       Obstetric History    G2   P1   T1   P0   A0   L1     SAB0   TAB0   Ectopic0   Multiple0   Live Births1        Gyn History            LMP 09/15/2015, Pregnant    Age at Menarche     Age at First Pregnancy     Age at Menopause     Gyn History Comments     Sexual Activity Yes; Female    Contraception No contraception data on record    Medical History     Surgical History           No past medical history on file.    No past surgical history on file.    No family history on file.    Social History      Social History    Marital status: Married     Spouse name: N/A    Number of children: N/A    Years of education: N/A     Occupational History    Not on file.     Social History Main Topics    Smoking status: Never Smoker    Smokeless tobacco: Never Used    Alcohol use No    Drug use: 3.00 per week     Special: Marijuana      Comment: AS NEEDED FOR INSOMNIA BEFORE PREG    Sexual activity: Yes     Partners: Male     Other Topics Concern    Not on file     Social History Narrative  No current facility-administered medications for this encounter.     No Known Allergies      Physical Exam:   05/18/16  1514   BP: 109/67   Pulse: 100   Resp: 18   Temp: 97.7 F (36.5 C)     General: no acute distress  HEENT: NC/AT  Cardiovascular: normal HR   Lungs: normal WOB   Abd: Soft, non-tender, non-distended. No rebound or guarding. NO RUQ tenderness  Ext: No edema, erythema, or calf tenderness    A/P: 31 year old G2P1001 at [redacted]w[redacted]d here for r/o PIH.     #Ruled out for preE  - all BPs since being in triage were wnl   - PreE labs normal (P/C: 0.12, Cr 0.5, plts 220)  - Asymptomatic   - Continue APFHRT     Strict return precautions given for painful contractions, vaginal bleeding, leakage of fluid, decreased fetal movement, fevers, chills, nausea, vomiting, RUQ pain, change in vision, or persistent headache.    D/w Dr. Kerry Hough (A)    Dennis Bast, MD  Obstetrics and Gynecology, PGY-2  Pager: [phone]      Attending Attestation  DOS: 05/18/16    Patient was seen and examined by me. I agree with the findings and plan of care as documented by the resident. I have included my revisions in the documentation. 30 6/7 weeks with [facility]. Triage evaluation has been reassuring with normal blood pressures and normal labs. Plan to discharge to outpatient follow-up.    Onnie Boer, MD  MFM Attending

## 2016-05-18 NOTE — Telephone Encounter (Signed)
Patient is [redacted] weeks pregnant and has been feeling dizzy. Patient BP is currently at 157/103. Transferred to clinic for assistance. Thank you

## 2016-05-19 ENCOUNTER — Ambulatory Visit (INDEPENDENT_AMBULATORY_CARE_PROVIDER_SITE_OTHER): Payer: Commercial Managed Care - PPO | Admitting: Maternal & Fetal Medicine

## 2016-05-19 ENCOUNTER — Ambulatory Visit (INDEPENDENT_AMBULATORY_CARE_PROVIDER_SITE_OTHER): Payer: Commercial Managed Care - PPO

## 2016-05-19 ENCOUNTER — Encounter: Payer: Self-pay | Admitting: Maternal & Fetal Medicine

## 2016-05-19 VITALS — BP 131/83 | HR 88 | Ht 67.0 in | Wt 219.4 lb

## 2016-05-19 DIAGNOSIS — O26899 Other specified pregnancy related conditions, unspecified trimester: Secondary | ICD-10-CM

## 2016-05-19 DIAGNOSIS — Q513 Bicornate uterus: Secondary | ICD-10-CM

## 2016-05-19 DIAGNOSIS — Z3A36 36 weeks gestation of pregnancy: Secondary | ICD-10-CM

## 2016-05-19 DIAGNOSIS — Z8759 Personal history of other complications of pregnancy, childbirth and the puerperium: Secondary | ICD-10-CM

## 2016-05-19 DIAGNOSIS — Z98891 History of uterine scar from previous surgery: Secondary | ICD-10-CM

## 2016-05-19 DIAGNOSIS — O3403 Maternal care for unspecified congenital malformation of uterus, third trimester: Secondary | ICD-10-CM

## 2016-05-19 DIAGNOSIS — Z6791 Unspecified blood type, Rh negative: Secondary | ICD-10-CM

## 2016-05-19 DIAGNOSIS — O0993 Supervision of high risk pregnancy, unspecified, third trimester: Secondary | ICD-10-CM

## 2016-05-19 DIAGNOSIS — Z364 Encounter for antenatal screening for fetal growth retardation: Principal | ICD-10-CM

## 2016-05-19 DIAGNOSIS — O09899 Supervision of other high risk pregnancies, unspecified trimester: Secondary | ICD-10-CM

## 2016-05-19 DIAGNOSIS — O321XX0 Maternal care for breech presentation, not applicable or unspecified: Secondary | ICD-10-CM

## 2016-05-19 DIAGNOSIS — O3400 Maternal care for unspecified congenital malformation of uterus, unspecified trimester: Secondary | ICD-10-CM

## 2016-05-19 LAB — URINALYSIS 8, POINT OF CARE TESTING SOM (LIO)
Bld, UA POCT: NEGATIVE
Glucose, UA POCT: NEGATIVE mg/dL
Ketone, UA POCT: NEGATIVE mg/dL
Nitrite, UA POCT: NEGATIVE
Specific Gravity, UA (POCT): 1.02 (ref 1.003–1.030)
pH, UA POCT: 7 (ref 5.0–8.0)

## 2016-05-19 NOTE — H&P (Signed)
[facility] H&P    Date of Evaluation: May 19, 2016    Medical Record Number: [id number]    Prenatal Care Provider: Dr. Kelby FamMajor    Chief Complaint and History of Present Illness:   Erin Harrington is a 31 year old G2P1001. She is scheduled for repeat cesarean section on 06/09/16 at 6383w0d gestation, indication is repeat and breech.  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, Rh negative, h/o LTCS x1, and h/o PIH in previous pregnancy.    Patient's last menstrual period was 09/15/2015.  EDD: 06/16/2016, by 1st Trimester Ultrasound    Patient Active Problem List    Diagnosis Date Noted   . Supervision of high-risk pregnancy, third trimester 03/24/2016     - Dating: 8 wk sono redate (EDD 06/16/16)   - Declined flu and tdap vaccines. Received Tdap 2015  - GBS collected 05/19/16     . Bicornuate uterus 03/24/2016     Serial growth US q4 weeks  Last growth US on 05/19/16, S=D     . History of low transverse cesarean section 03/24/2016     H/o c-section x1 for breech    This fetus is breech and will likely stay breech due to bicornuate uterus. Repeat CS scheduled at 39 weeks on 06/09/16 at 9am     . Rh negative state in antepartum period 03/24/2016     S/p rhogam on 03/24/16     . History of pregnancy induced hypertension 03/24/2016     Developed at 37 weeks with prior pregnancy, had C section at 38 weeks.  Take ASA 81 mg to decrease risk  Check BP at home once daily         OB History   Gravida Para Term Preterm AB Living   2 1 1   1    SAB TAB Ectopic Multiple Live Births       1      # Outcome Date GA Lbr Len/2nd Weight Sex Delivery Anes PTL Lv   2 Current            1 Term 522015 2267w0d   M CS-LTranv   LIV          Gyn History:  Pap screening: Last pap on 04/2014. Results were negative. No history of abnormal paps.  STI history: Denies     Past Medical History:  History reviewed. No pertinent past medical history.    Past Surgical History:  Past Surgical History:   Procedure Laterality Date   . CESAREAN  SECTION, LOW TRANSVERSE  2015       Medications:  Current Outpatient Prescriptions   Medication Sig   . aspirin 81 MG tablet Take 81 mg by mouth daily.   . Prenatal MV-Min-FA-Omega-3 (PRENATAL GUMMIES/DHA & FA) 0.4-32.5 MG CHEW      No current facility-administered medications for this visit.        Allergies:  No Known Allergies    Family History:   Family History   Problem Relation Age of Onset   . Hypertension Mother    . No Known Problems Father    . No Known Problems Sister    . No Known Problems Brother    . Dementia Maternal Grandmother    . Diabetes Maternal Grandfather    . Melanoma Cancer Maternal Grandfather    . Cancer Paternal Grandmother      THROAT   . Dementia Paternal Grandfather    . No Known Problems Brother  Social History:   Social History     Social History   . Marital status: Married     Spouse name: N/A   . Number of children: 1   . Years of education: N/A     Occupational History   . UNEMPLOYED      Social History Main Topics   . Smoking status: Former Smoker     Types: Cigarettes     Start date: 2008     Quit date: 2009   . Smokeless tobacco: Never Used   . Alcohol use No   . Drug use: 3.00 per week     Special: Marijuana      Comment: AS NEEDED FOR INSOMNIA BEFORE PREG   . Sexual activity: Yes     Partners: Male     Other Topics Concern   . Not on file     Social History Narrative       Review of Systems: 12 point Review of Systems was performed.  Pertinent negative and positives in HPI.    Physical Exam:  Deferred until day of surgery    Assessment and Plan:  Erin Harrington is a 31 year old G2P1001. She is scheduled for repeat cesarean section on 06/09/16 at [redacted]w[redacted]d gestation, indication is repeat and breech.     -The risks and benefits and alternatives of cesarean delivery were discussed with the patient. Specifically, the patient understands that the risks of cesarean delivery include but are not limited to bleeding, infection, and damage to adjacent organs. The patient desires  to proceed and has signed the procedure consent form with Dr. Kelby Fam    -The risks, benefits, and alternatives of blood transfusion were discussed with the patient. The patient understands that there is a 1% risk of allergic reaction or other adverse reaction such as TRALI, which may be potentially life threatening. The patient also understands that there is a risk of viral infection such as HIV or hepatitis. However, the risk of a viral infection is extremely low. For hepatitis B, the risk is 1:200-400,000, and for hepatitis C and HIV, the risk is 1:1.5 million blood transfusions. The patient understands that we would only administer blood if we thought it was a life-threatening situation. All questions were answered prior to patient signing the blood transfusion consent.    -Bilateral Tubal Ligation Declined    -Chlorhexidine wash instructions given to patient.  -Preanesthesia screening completed.  -NPO 8 hours before surgery and arrive 3 hours before scheduled surgery time.    Renita Papa, NP

## 2016-05-19 NOTE — Progress Notes (Signed)
05/19/2016  ROB visit  31 year old G2P1001 7468w0d    -Denies abdominal pain, vaginal bleeding, leakage of fluid, preeclampsia symptoms, dysuria. Reports normal fetal movements.  -Went to L&D yesterday for elevated [facility], 157[facility]103. Ruled out for preeclampsia. BP today is 131/83,  UA dip trace protein. Denies HA, vision changes, RUQ pain. She has noticeable swelling on hands, face, lower extremities. Strict preE precautions given. Advised checking BP once a day in morning. She developed PIH at 37 weeks last pregnancy and was delivered at 38 weeks  -Repeat c-section scheduled on 06/09/16 at 9am.   -RTC 1 week    Patient Active Problem List    Diagnosis Date Noted   . Supervision of high-risk pregnancy, third trimester 03/24/2016     - Dating: 8 wk sono redate (EDD 06/16/16)   - Declined flu and tdap vaccines. Received Tdap 2015  - GBS collected 05/19/16     . Bicornuate uterus 03/24/2016     Serial growth US q4 weeks  Last growth US on 05/19/16, S=D     . History of low transverse cesarean section 03/24/2016     H/o c-section x1 for breech    This fetus is breech and will likely stay breech due to bicornuate uterus. Repeat CS scheduled at 39 weeks on 06/09/16 at 9am     . Rh negative state in antepartum period 03/24/2016     S/p rhogam on 03/24/16     . History of pregnancy induced hypertension 03/24/2016     Developed at 37 weeks with prior pregnancy, had C section at 38 weeks.  Take ASA 81 mg to decrease risk  Check BP at home once daily       Renita PapaNina Pung, NP  Supervising Physician: Dr. Kelby FamMajor

## 2016-05-19 NOTE — Procedures (Signed)
2ND/3RD TRIMESTER                     May 19 2016                     RE: Erin Harrington                                  Physician: Maxwell Caul,       M.D.       MR#: [id number]                                      [address]       DOB: 31-Jan-1986                                   [address]       Visit#: [id number]                               [place], [place]  [address]       (Exam #: [id number])                           Fax:              The LMP of this 31 year old, gravida 2, para 1 patient was Sep 15 2015,       her working EDD is Jun 16 2016 and the current gestational age is 36   weeks       0 days by [employer]. A sonographic examination was performed on May 19 2016       using real time equipment.              Multiple longitudinal and transverse sections revealed a singleton       intrauterine pregnancy with the fetus in breech presentation. The   placenta       is anterior, left lateral in implantation, grade II in appearance, and       there is no placenta previa.              INDICATIONS              Maternal care for congenital malformation of uterus  [O340]       Personal history of other complications of pregnancy, childbirth and   the       puerperium  [Z8759]       Maternal care for scar from previous cesarean delivery  [O3421]       Encounter for antenatal screening for fetal growth retardation  [Z364]              Exam Types              Complete Fetal Survey (46962) (Quantity: 1)              MEASUREMENTS              BPD              8.8 cm        35 weeks 4  days* (44%)       HC              32.7 cm        36 weeks 4 days* (48%)       AC              32.9 cm        37 weeks 0 days* (68%)       Femur            6.9 cm        35 weeks 0 days* (42%)       Humerus          6.1 cm        35 weeks 2 days  (49%)              OFD             11.6 cm              HC/AC           0.99       FL/AC           0.21       FL/BPD          0.78       Ceph Index      0.76        EFW (Ac/Fl/Hc)  2924 grams - 6 lbs 7 oz                 (60%)              THE AVERAGE GESTATIONAL AGE is 36 weeks 1 day +/- 21 days.              ANATOMY COMMENTS              The anatomic survey was limited by the late gestational age and fetal       position.  The intracranial contents appeared within normal limits.    The       cardiac views are limited but appear within normal limits.  The   kidneys,       bladder and stomach bubble were visualized.  The spine appears grossly       intact.  The extremities appear normal.  The placenta and the cord   appear       within normal limits without obvious abnormalities.              UTERUS              The uterus was visualized, midplane in orientation.              ADNEXA              The left ovary was not visualized. The right ovary was not visualized.              AMNIOTIC FLUID              Q1: 4.3      Q2: 4.2      Q3: 6.8      Q4: 3.5       AFI Total = 18.7 cm       Amniotic Fluid: Normal              IMPRESSION              Singleton IUP  36 weeks and 1 day by this ultrasound. (EDD=Jun 15 2016)       Breech presentation       Fetal growth appeared normal       Regular fetal heart rate of 154 bpm       Anterior, left lateral placenta       No placenta previa              GENERAL COMMENT              The patient presents for fetal growth evaluation with a history of a       bicornuate uterus.              The measurements are consistent with the previously established       gestational age.              Visualization of the fetal anatomy was limited due to the advancing       gestational age of the fetus.  No obvious congenital malformations   were       observed.              April Bradley, R.D.M.S.       Sonographer: FMF # [id number]              Maxwell Caul, M.D.       Professor: [id number]       Electronically signed 05/19/16 11:06

## 2016-05-22 LAB — GROUP B STREP CULTURE: Culture Result: NEGATIVE

## 2016-05-26 ENCOUNTER — Encounter: Payer: Self-pay | Admitting: Ob/Gyn

## 2016-05-26 ENCOUNTER — Ambulatory Visit (INDEPENDENT_AMBULATORY_CARE_PROVIDER_SITE_OTHER): Payer: Commercial Managed Care - PPO | Admitting: Ob/Gyn

## 2016-05-26 ENCOUNTER — Ambulatory Visit: Payer: Commercial Managed Care - PPO

## 2016-05-26 VITALS — BP 126/81 | HR 90 | Temp 97.0°F | Wt 223.8 lb

## 2016-05-26 DIAGNOSIS — Z98891 History of uterine scar from previous surgery: Secondary | ICD-10-CM

## 2016-05-26 DIAGNOSIS — Z3A37 37 weeks gestation of pregnancy: Secondary | ICD-10-CM

## 2016-05-26 DIAGNOSIS — O3403 Maternal care for unspecified congenital malformation of uterus, third trimester: Secondary | ICD-10-CM

## 2016-05-26 DIAGNOSIS — O0993 Supervision of high risk pregnancy, unspecified, third trimester: Secondary | ICD-10-CM

## 2016-05-26 DIAGNOSIS — Q513 Bicornate uterus: Secondary | ICD-10-CM

## 2016-05-26 DIAGNOSIS — Z6791 Unspecified blood type, Rh negative: Secondary | ICD-10-CM

## 2016-05-26 DIAGNOSIS — O34219 Maternal care for unspecified type scar from previous cesarean delivery: Secondary | ICD-10-CM

## 2016-05-26 DIAGNOSIS — Z8759 Personal history of other complications of pregnancy, childbirth and the puerperium: Secondary | ICD-10-CM

## 2016-05-26 LAB — URINALYSIS 8, POINT OF CARE TESTING SOM (LIO)
Bld, UA POCT: NEGATIVE
Glucose, UA POCT: NEGATIVE mg/dL
Ketone, UA POCT: NEGATIVE mg/dL
Nitrite, UA POCT: NEGATIVE
Protein, UA POCT: NEGATIVE mg/dL
Specific Gravity, UA (POCT): 1.025 (ref 1.003–1.030)
pH, UA POCT: 6.5 (ref 5.0–8.0)

## 2016-05-26 NOTE — Progress Notes (Signed)
05/26/2016  ROB visit  31 year old G2P1001 2165w0d    -Denies abdominal pain, vaginal bleeding, leakage of fluid, preeclampsia symptoms, dysuria. Reports normal fetal movements.  -Starting to feel minor sore throat and tired. Advised rest and fluids. Gargle with warm salt water. Flu precautions given, she did not get flu vaccine   -RTC 1 week  BP 126/81  Pulse 90  Temp 97 F (36.1 C)  Wt 101.5 kg (223 lb 12.3 oz)  LMP 09/15/2015  BMI 35.05 kg/m2    Patient Active Problem List    Diagnosis Date Noted   . Supervision of high-risk pregnancy, third trimester 03/24/2016     - Dating: 8 wk sono redate (EDD 06/16/16)   - Declined flu and tdap vaccines. Received Tdap 2015  - GBS negative     . Bicornuate uterus 03/24/2016     Serial growth US q4 weeks  Last growth US on 05/19/16, S=D     . History of low transverse cesarean section 03/24/2016     H/o c-section x1 for breech    This fetus is breech and will likely stay breech due to bicornuate uterus. Repeat CS scheduled at 39 weeks on 06/09/16 at 9am     . Rh negative state in antepartum period 03/24/2016     S/p rhogam on 03/24/16     . History of pregnancy induced hypertension 03/24/2016     Developed at 37 weeks with prior pregnancy, had C section at 38 weeks.  Take ASA 81 mg to decrease risk  Check BP at home once daily       Renita PapaNina Pung, NP  Supervising Physician: Dr. Kelby FamMajor

## 2016-05-26 NOTE — Patient Instructions (Signed)
1. ROB 1 week

## 2016-06-01 ENCOUNTER — Telehealth: Payer: Self-pay | Admitting: Ob/Gyn

## 2016-06-01 NOTE — Telephone Encounter (Signed)
Spoke to [place] she is feeling much better will keep rob visit for tomorrow.

## 2016-06-01 NOTE — Telephone Encounter (Signed)
[facility] and patient was transferred over for assistance. Thank you

## 2016-06-01 NOTE — Telephone Encounter (Signed)
Patient is [redacted] weeks pregnant with possible food poising or stomach virus. Patient had non stop vomiting last night for about 12 hours. Transferred call to office. Thank you.

## 2016-06-01 NOTE — Telephone Encounter (Signed)
38 weeks c/o Vomiting yesterday w/ Diarrhea.   Feels better today told to increase H20, eat Saltine crackers ... If unable to keep anything down will go to   L/D asap... Otherwise call me back around 11:30 to   See how she is feeling.

## 2016-06-02 ENCOUNTER — Ambulatory Visit (INDEPENDENT_AMBULATORY_CARE_PROVIDER_SITE_OTHER): Payer: Commercial Managed Care - PPO | Admitting: Ob/Gyn

## 2016-06-02 ENCOUNTER — Encounter: Payer: Self-pay | Admitting: Ob/Gyn

## 2016-06-02 DIAGNOSIS — O0993 Supervision of high risk pregnancy, unspecified, third trimester: Principal | ICD-10-CM

## 2016-06-02 DIAGNOSIS — Z6791 Unspecified blood type, Rh negative: Secondary | ICD-10-CM

## 2016-06-02 DIAGNOSIS — Z8759 Personal history of other complications of pregnancy, childbirth and the puerperium: Secondary | ICD-10-CM

## 2016-06-02 DIAGNOSIS — Z3A38 38 weeks gestation of pregnancy: Secondary | ICD-10-CM

## 2016-06-02 LAB — URINALYSIS 8, POINT OF CARE TESTING SOM (LIO)
Glucose, UA POCT: NEGATIVE mg/dL
Ketone, UA POCT: NEGATIVE mg/dL
Nitrite, UA POCT: NEGATIVE
Protein, UA POCT: NEGATIVE mg/dL
Specific Gravity, UA (POCT): 1.01 (ref 1.003–1.030)
pH, UA POCT: 6 (ref 5.0–8.0)

## 2016-06-02 NOTE — Progress Notes (Signed)
06/02/2016  ROB Visit  31 year old 1882w0d 362P1001    States she had the stomach flu 3 days ago, accompanied by diarrhea, body chills, and sweats. She was unable to keep food down. Her symptoms have since subsided and is now able to keep food down.  - Remain hydrated    States she has noticed less fetal movement. Endorses hiccups.  - Report to L&D if needed    Patient Active Problem List    Diagnosis Date Noted    Supervision of high-risk pregnancy, third trimester 03/24/2016     - Dating: 8 wk sono redate (EDD 06/16/16)   - Declined flu and tdap vaccines. Received Tdap 2015  - GBS negative      Bicornuate uterus 03/24/2016     Serial growth US q4 weeks  Last growth US on 05/19/16, S=D      History of low transverse cesarean section 03/24/2016     H/o c-section x1 for breech    This fetus is breech and will likely stay breech due to bicornuate uterus. Repeat CS scheduled at 39 weeks on 06/09/16 at 9am      Rh negative state in antepartum period 03/24/2016     S/p rhogam on 03/24/16      History of pregnancy induced hypertension 03/24/2016     Developed at 37 weeks with prior pregnancy, had C section at 38 weeks.  Take ASA 81 mg to decrease risk, stop taking 06/06/16  Check BP at home once daily       - RTC for 2 week postpartum visit    Scribe Attestation  The notes I am recording reflect only actions made by and judgments taken by this provider, Dr. Kelby FamMajor, for whom I am scribing today.  I have performed no independent clinical work.    Alpha GulaLizbeth De Nova    ____________________________________________________________________    Provider Attestation for Scribed Note    As the attending provider, I agree with the scribed content.  Any changes or edits are noted in the text above.    Okey Regalarol A Stanly Si

## 2016-06-02 NOTE — Patient Instructions (Signed)
2 weeks post C/S

## 2016-06-04 ENCOUNTER — Ambulatory Visit
Admission: AD | Admit: 2016-06-04 | Discharge: 2016-06-04 | Disposition: A | Discharge status: Home / Self Care | Payer: Commercial Managed Care - PPO | Source: Ambulatory Visit | Attending: Ob/Gyn | Admitting: Ob/Gyn

## 2016-06-04 ENCOUNTER — Encounter: Payer: Self-pay | Admitting: Student in an Organized Health Care Education/Training Program

## 2016-06-04 DIAGNOSIS — O368130 Decreased fetal movements, third trimester, not applicable or unspecified: Secondary | ICD-10-CM

## 2016-06-04 DIAGNOSIS — Z3A38 38 weeks gestation of pregnancy: Secondary | ICD-10-CM | POA: Insufficient documentation

## 2016-06-04 DIAGNOSIS — O133 Gestational [pregnancy-induced] hypertension without significant proteinuria, third trimester: Secondary | ICD-10-CM | POA: Insufficient documentation

## 2016-06-04 DIAGNOSIS — O34219 Maternal care for unspecified type scar from previous cesarean delivery: Secondary | ICD-10-CM

## 2016-06-04 DIAGNOSIS — O09893 Supervision of other high risk pregnancies, third trimester: Secondary | ICD-10-CM | POA: Insufficient documentation

## 2016-06-04 DIAGNOSIS — O0993 Supervision of high risk pregnancy, unspecified, third trimester: Secondary | ICD-10-CM

## 2016-06-04 DIAGNOSIS — O321XX0 Maternal care for breech presentation, not applicable or unspecified: Secondary | ICD-10-CM | POA: Insufficient documentation

## 2016-06-04 DIAGNOSIS — Z8759 Personal history of other complications of pregnancy, childbirth and the puerperium: Secondary | ICD-10-CM | POA: Insufficient documentation

## 2016-06-04 LAB — CBC WITH DIFF, BLOOD
ANC automated: 7.1 10*3/uL (ref 2.0–8.1)
Basophils %: 0.2 %
Basophils Absolute: 0 10*3/uL (ref 0.0–0.2)
Eosinophils %: 0 %
Eosinophils Absolute: 0 10*3/uL (ref 0.0–0.5)
Hematocrit: 35.3 % (ref 34.0–44.0)
Hgb: 11.9 G/DL (ref 11.5–15.0)
Lymphocytes %: 17.5 %
Lymphocytes Absolute: 1.6 10*3/uL (ref 0.9–3.3)
MCH: 29.7 PG (ref 27.0–33.5)
MCHC: 33.7 G/DL (ref 32.0–35.5)
MCV: 88 FL (ref 81.5–97.0)
MPV: 9 FL (ref 7.2–11.7)
Monocytes %: 6.2 %
Monocytes Absolute: 0.6 10*3/uL (ref 0.0–0.8)
Neutrophils % (A): 76.1 %
PLT Count: 193 10*3/uL (ref 150–400)
RBC: 4.01 10*6/uL (ref 3.70–5.00)
RDW-CV: 13.5 % (ref 11.6–14.4)
White Bld Cell Count: 9.4 10*3/uL (ref 4.0–10.5)

## 2016-06-04 LAB — COMPREHENSIVE METABOLIC PANEL, BLOOD
ALT: 12 U/L (ref 7–52)
AST: 15 U/L (ref 13–39)
Albumin: 3.2 G/DL — ABNORMAL LOW (ref 3.7–5.3)
Alk Phos: 169 U/L — ABNORMAL HIGH (ref 34–104)
BUN: 7 mg/dL (ref 7–25)
Bilirubin, Total: 0.5 mg/dL (ref 0.0–1.4)
CO2: 21 mmol/L (ref 21–31)
Calcium: 8.7 mg/dL (ref 8.6–10.3)
Chloride: 109 mmol/L — ABNORMAL HIGH (ref 98–107)
Creat: 0.5 mg/dL — ABNORMAL LOW (ref 0.6–1.2)
Electrolyte Balance: 8 mmol/L (ref 2–12)
Glucose: 101 mg/dL (ref 70–115)
Potassium: 3.4 mmol/L — ABNORMAL LOW (ref 3.5–5.1)
Protein, Total: 6 G/DL (ref 6.0–8.3)
Sodium: 138 mmol/L (ref 136–145)
eGFR - high estimate: >60 (ref >59)
eGFR - low estimate: >60 (ref >59)

## 2016-06-04 LAB — RANDOM URINE CREATININE: Creatinine Urine-Random: 137.3 MG/DL

## 2016-06-04 LAB — RANDOM URINE TOTAL PROTEIN: Protein, Urine-Random: 12 MG/DL

## 2016-06-04 NOTE — Progress Notes (Signed)
OBEV Triage Note    Clinic: Prenatal Care: [facility] [address]  Primary Care Provider: No Pcp, Per Patient  Attending MD: Toma Aran, MD    Chief Complaint   Patient presents with    Decreased Fetal Movement     pt also has a history of PIH states BP elevated at home this AM              History of Present Illness:  Erin Harrington [id number] is a 31 year old G2P1001 at [redacted]w[redacted]d EGA dated by 8 week sonogram here for evaluation for decreased fetal movements. Pregnancy is complicated by bicornuate uterus (s/p normal growth scan 1/30),h/o LTCS, and pregnancy induced hypertension (d/c ASA 06/06/16). States she has not felt fetal movements since 0700 this morning. Generally feels him every hour, today she has felt fetal movements only 2-3 times.     This AM at home, had bp of 143/93. Normally, she is in the 120s-130s/80s. Drinking a normal amount, denies lightheadedness or dizziness.     Endorses a few cramps this morning, none since then. Also with mild headache for a few hours this morning, resolved spontaneously.     Denies VB, LOF, vaginal discharge, RUQ pain, vision changes.      rLTCS scheduled for Tuesday (2/20)     [facility]: [place] [facility]     Patient Active Problem List    Diagnosis Date Noted    Supervision of high-risk pregnancy, third trimester 03/24/2016     - Dating: 8 wk sono redate (EDD 06/16/16)   - Declined flu and tdap vaccines. Received Tdap 2015  - GBS negative      Bicornuate uterus 03/24/2016     Serial growth [place] q4 weeks  Last growth [place] on 05/19/16, S=D      History of low transverse cesarean section 03/24/2016     H/o c-section x1 for breech    This fetus is breech and will likely stay breech due to bicornuate uterus. Repeat CS scheduled at 39 weeks on 06/09/16 at 9am      Rh negative state in antepartum period 03/24/2016     S/p rhogam on 03/24/16      History of pregnancy induced hypertension 03/24/2016     Developed at 37 weeks with prior pregnancy, had C section at 38 weeks.   Take ASA 81 mg to decrease risk, stop taking 06/06/16  Check BP at home once daily         Obstetric History    G2   P1   T1   P0   A0   L1     SAB0   TAB0   Ectopic0   Multiple0   Live Births1         Gyn History            LMP 09/15/2015, Pregnant    Age at Menarche 59    Age at First Pregnancy     Age at Menopause     Gyn History Comments LMP- May 28th,2017. Patient is regular, every 27 days, light to moderate bleeding. Last pap 04/2014- normalSTI- denies     Sexual Activity Yes; Female    Contraception No contraception data on record    Medical History     Surgical History CESAREAN SECTION, LOW TRANSVERSE 2015              No past medical history on file.     Past Surgical History:   Procedure Laterality  Date    CESAREAN SECTION, LOW TRANSVERSE  2015       Family History   Problem Relation Age of Onset    Hypertension Mother     No Known Problems Father     No Known Problems Sister     No Known Problems Brother     Dementia Maternal Grandmother     Diabetes Maternal Grandfather     Melanoma Cancer Maternal Grandfather     Cancer Paternal Grandmother      THROAT    Dementia Paternal Grandfather     No Known Problems Brother     Blood Disorder Neg Hx     Bleeding Disorder Neg Hx     Birth Defects Neg Hx        Social History     Social History    Marital status: Married     Spouse name: N/A    Number of children: 1    Years of education: N/A     Occupational History    UNEMPLOYED      Social History Main Topics    Smoking status: Former Smoker     Types: Cigarettes     Start date: 2008     Quit date: 2009    Smokeless tobacco: Never Used    Alcohol use No    Drug use: 3.00 per week     Special: Marijuana      Comment: AS NEEDED FOR INSOMNIA BEFORE PREG    Sexual activity: Yes     Partners: Male     Social Activities of Daily Living Present    Not on file     Social History Narrative        Medications:   PNV  ASA 81     No Known Allergies        Allergies:  No Known Allergies     Review of Systems:   A comprehensive review of systems was negative    Vitals:   06/04/16  1320 06/04/16  1335 06/04/16  1350   BP: 107/72 97/60 101/65   Pulse: 110 86 90       General appearance: in no apparent distress  Heart: regular rate and rhythm  Lungs:clear to auscultation  Abdomen: Abdomen soft, non-tender. BS normal. No masses, organomegaly  Extremities: Normal      FHR: 135/moderate variability/+accels/no decels   Uterine Activity: 1 q 10min      Results for Erin Harrington (MRN [id number]) as of 06/04/2016 16:20   Ref. Range 06/04/2016 14:48   Sodium Latest Ref Range: 136 - 145 mmol/L 138   Potassium Latest Ref Range: 3.5 - 5.1 mmol/L 3.4 (L)   Chloride Latest Ref Range: 98 - 107 mmol/L 109 (H)   CO2 Latest Ref Range: 21 - 31 mmol/L 21   Anion Gap Latest Ref Range: 2 - 12 mmol/L 8   BUN Latest Ref Range: 7 - 25 mg/dL 7   Creatinine Latest Ref Range: 0.6 - 1.2 mg/dL 0.5 (L)   GFR Latest Ref Range: >59  >60   Glucose Latest Ref Range: 70 - 115 mg/dL 811101   Calcium Latest Ref Range: 8.6 - 10.3 mg/dL 8.7     Results for Erin Harrington (MRN [id number]) as of 06/04/2016 16:20   Ref. Range 06/04/2016 14:48   Alkaline Phos Latest Ref Range: 34 - 104 U/L 169 (H)   ALT (SGPT) Latest Ref Range: 7 - 52 U/L 12   AST (SGOT)  Latest Ref Range: 13 - 39 U/L 15   Bilirubin, Tot Latest Ref Range: 0.0 - 1.4 mg/dL 0.5   Albumin Latest Ref Range: 3.7 - 5.3 G/DL 3.2 (L)   Total Protein Latest Ref Range: 6.0 - 8.3 G/DL 6.0   GFR Latest Ref Range: >59  >60   GFR African American Latest Ref Range: >59  >60     Results for Erin Harrington (MRN [id number]) as of 06/04/2016 16:20   Ref. Range 06/04/2016 13:00   Creatinine, Urine Latest Units: MG/DL 454.0   Protein, UR-Spot Latest Units: MG/DL 12         Assessment/Plan:  Erin Harrington is a 31 year old G2P1001 presents for r/o PIH     #r/o PIH   --Asymptomatic   --BPs wnl   --P/C: 0.8     - Fetal Well-being: Reactive   - Dispo: home, follow up as scheduled with Dr. Kelby Fam      Plan of care discussed with attending, Dr. Melina Schools.    06/04/2016   1:28 PM     Tonna Corner, (R2)   Obstetrics and Gynecology     ----  [facility] Attending Eval Note - late entry for 06/04/16 at 16:35    I introduced myself to this patient as the attending physician.    31 year old G2P1001 at [redacted]w[redacted]d with decreased fetal movement, now resolved and elevated BP on newly-purchased home BP cuff.  Completely normotensive with no clinical or laboratory evidence of preeclampsia here.  Fetal wellbeing reassuring.    Patient was seen and evaluated with Dr. Verdie Mosher and we formulated the plan of care together, as documented above.    Patient expressed good understanding of the plan of care and all questions were answered.    Toma Aran, Attending MD  06/05/16 3:46 PM

## 2016-06-04 NOTE — Interdisciplinary (Signed)
Pt discharged to home with labor and pih precautions and reinforced fetal kick counts

## 2016-06-09 ENCOUNTER — Inpatient Hospital Stay
Admission: RE | Admit: 2016-06-09 | Discharge: 2016-06-12 | DRG: 766 | Disposition: A | Discharge status: Home / Self Care | Payer: Commercial Managed Care - PPO | Source: Ambulatory Visit | Attending: Obstetrics & Gynecology | Admitting: Obstetrics & Gynecology

## 2016-06-09 ENCOUNTER — Ambulatory Visit (HOSPITAL_BASED_OUTPATIENT_CLINIC_OR_DEPARTMENT_OTHER): Payer: Commercial Managed Care - PPO | Admitting: Anesthesiology

## 2016-06-09 ENCOUNTER — Ambulatory Visit: Payer: Commercial Managed Care - PPO | Admitting: Anesthesiology

## 2016-06-09 ENCOUNTER — Encounter
Admission: RE | Disposition: A | Discharge status: Home Health | Payer: Self-pay | Source: Ambulatory Visit | Attending: Obstetrics & Gynecology

## 2016-06-09 ENCOUNTER — Inpatient Hospital Stay: Admit: 2016-06-09 | Payer: Self-pay | Admitting: Obstetrics & Gynecology

## 2016-06-09 DIAGNOSIS — Q513 Bicornate uterus: Secondary | ICD-10-CM

## 2016-06-09 DIAGNOSIS — O34211 Maternal care for low transverse scar from previous cesarean delivery: Secondary | ICD-10-CM | POA: Diagnosis present

## 2016-06-09 DIAGNOSIS — Z98891 History of uterine scar from previous surgery: Secondary | ICD-10-CM

## 2016-06-09 DIAGNOSIS — O321XX0 Maternal care for breech presentation, not applicable or unspecified: Secondary | ICD-10-CM | POA: Diagnosis present

## 2016-06-09 DIAGNOSIS — O3403 Maternal care for unspecified congenital malformation of uterus, third trimester: Principal | ICD-10-CM | POA: Diagnosis present

## 2016-06-09 DIAGNOSIS — O0993 Supervision of high risk pregnancy, unspecified, third trimester: Secondary | ICD-10-CM

## 2016-06-09 DIAGNOSIS — Z3A39 39 weeks gestation of pregnancy: Secondary | ICD-10-CM

## 2016-06-09 LAB — TYPE, SCREEN + HOLD (PRETRANSFUSION TESTING)
ABO/Rh(D): O NEG
Antibody Screen Result: NEGATIVE
Units Ordered: 2

## 2016-06-09 LAB — HEMOGRAM, BLOOD
Hematocrit: 37.1 % (ref 34.0–44.0)
Hgb: 12.2 G/DL (ref 11.5–15.0)
MCH: 29.2 PG (ref 27.0–33.5)
MCHC: 33 G/DL (ref 32.0–35.5)
MCV: 88.4 FL (ref 81.5–97.0)
MPV: 9.7 FL (ref 7.2–11.7)
PLT Count: 222 10*3/uL (ref 150–400)
RBC: 4.19 10*6/uL (ref 3.70–5.00)
RDW-CV: 13.8 % (ref 11.6–14.4)
White Bld Cell Count: 9.1 10*3/uL (ref 4.0–10.5)

## 2016-06-09 SURGERY — Surgical Case
Anesthesia: Spinal | Wound class: Class II (Clean Contaminated)

## 2016-06-09 MED ORDER — MORPHINE SULFATE (PF) 1 MG/ML IJ SOLN
INTRAMUSCULAR | Status: DC | PRN
Start: 2016-06-09 — End: 2016-06-09
  Administered 2016-06-09 (×2): .2 mg via INTRATHECAL

## 2016-06-09 MED ORDER — TETANUS-DIPHTH-ACELL PERTUSSIS 5-2.5-18.5 LF-MCG/0.5 IM SUSP
0.5000 mL | Freq: Once | INTRAMUSCULAR | Status: AC
Start: 2016-06-09 — End: 2016-06-10
  Filled 2016-06-09: qty 0.5

## 2016-06-09 MED ORDER — MISOPROSTOL 200 MCG OR TABS
1000.0000 ug | ORAL_TABLET | Freq: Once | ORAL | Status: DC | PRN
Start: 2016-06-09 — End: 2016-06-09

## 2016-06-09 MED ORDER — IBUPROFEN 600 MG OR TABS
600.0000 mg | ORAL_TABLET | Freq: Four times a day (QID) | ORAL | Status: DC
Start: 2016-06-09 — End: 2016-06-09

## 2016-06-09 MED ORDER — HYDROCODONE-ACETAMINOPHEN 5-325 MG OR TABS
2.0000 | ORAL_TABLET | ORAL | Status: DC | PRN
Start: 2016-06-09 — End: 2016-06-12
  Administered 2016-06-10 – 2016-06-11 (×6): 2 via ORAL
  Filled 2016-06-09 (×5): qty 2

## 2016-06-09 MED ORDER — METHYLERGONOVINE MALEATE 0.2 MG/ML IJ SOLN
0.2000 mg | Freq: Once | INTRAMUSCULAR | Status: DC | PRN
Start: 2016-06-09 — End: 2016-06-09

## 2016-06-09 MED ORDER — METOCLOPRAMIDE HCL 5 MG/ML IJ SOLN
INTRAMUSCULAR | Status: DC | PRN
Start: 2016-06-09 — End: 2016-06-09
  Administered 2016-06-09 (×2): 10 mg via INTRAVENOUS

## 2016-06-09 MED ORDER — CARBOPROST TROMETHAMINE 250 MCG/ML IM SOLN
250.0000 ug | Freq: Once | INTRAMUSCULAR | Status: DC | PRN
Start: 2016-06-09 — End: 2016-06-09

## 2016-06-09 MED ORDER — LAN-O-SOOTHE EX CREA
TOPICAL_CREAM | CUTANEOUS | Status: DC | PRN
Start: 2016-06-09 — End: 2016-06-09

## 2016-06-09 MED ORDER — FENTANYL CITRATE (PF) 100 MCG/2ML IJ SOLN
INTRAMUSCULAR | Status: DC | PRN
Start: 2016-06-09 — End: 2016-06-09
  Administered 2016-06-09 (×2): 15 ug via INTRATHECAL

## 2016-06-09 MED ORDER — MISOPROSTOL 200 MCG OR TABS
800.0000 ug | ORAL_TABLET | ORAL | Status: DC | PRN
Start: 2016-06-09 — End: 2016-06-09

## 2016-06-09 MED ORDER — LAN-O-SOOTHE EX CREA
TOPICAL_CREAM | CUTANEOUS | Status: DC | PRN
Start: 2016-06-09 — End: 2016-06-12

## 2016-06-09 MED ORDER — METHYLERGONOVINE MALEATE 0.2 MG/ML IJ SOLN
0.2000 mg | Freq: Once | INTRAMUSCULAR | Status: DC | PRN
Start: 2016-06-09 — End: 2016-06-12

## 2016-06-09 MED ORDER — ONDANSETRON HCL 4 MG/2ML IV SOLN
INTRAMUSCULAR | Status: DC | PRN
Start: 2016-06-09 — End: 2016-06-09
  Administered 2016-06-09 (×2): 4 mg via INTRAVENOUS

## 2016-06-09 MED ORDER — FERROUS SULFATE 325 (65 FE) MG OR TABS
325.0000 mg | ORAL_TABLET | Freq: Every day | ORAL | Status: DC
Start: 2016-06-10 — End: 2016-06-12
  Administered 2016-06-10 – 2016-06-12 (×4): 325 mg via ORAL
  Filled 2016-06-09 (×3): qty 1

## 2016-06-09 MED ORDER — MEASLES, MUMPS & RUBELLA VAC SC INJ
0.5000 mL | INJECTION | SUBCUTANEOUS | Status: DC
Start: 2016-06-09 — End: 2016-06-09

## 2016-06-09 MED ORDER — METHYLERGONOVINE MALEATE 0.2 MG/ML IJ SOLN
0.2000 mg | INTRAMUSCULAR | Status: DC | PRN
Start: 2016-06-09 — End: 2016-06-09

## 2016-06-09 MED ORDER — ONDANSETRON HCL 4 MG/2ML IV SOLN
4.0000 mg | INTRAMUSCULAR | Status: DC | PRN
Start: 2016-06-09 — End: 2016-06-09

## 2016-06-09 MED ORDER — OXYTOCIN 30 UNITS/500 ML LR LV (PREMIX) (~~LOC~~)
125.0000 mL/h | INTRAVENOUS | Status: DC
Start: 2016-06-09 — End: 2016-06-09

## 2016-06-09 MED ORDER — MISOPROSTOL 200 MCG OR TABS
1000.0000 ug | ORAL_TABLET | Freq: Once | ORAL | Status: DC | PRN
Start: 2016-06-09 — End: 2016-06-12

## 2016-06-09 MED ORDER — PRENATABS RX 29-1 MG PO TABS
1.0000 | ORAL_TABLET | Freq: Every day | ORAL | Status: DC
Start: 2016-06-10 — End: 2016-06-12
  Administered 2016-06-10 – 2016-06-12 (×4): 1 via ORAL
  Filled 2016-06-09 (×3): qty 1

## 2016-06-09 MED ORDER — HYDROCODONE-ACETAMINOPHEN 5-325 MG OR TABS
1.0000 | ORAL_TABLET | ORAL | Status: DC | PRN
Start: 2016-06-09 — End: 2016-06-09

## 2016-06-09 MED ORDER — OXYTOCIN 30 UNITS/500 ML LR LV (PREMIX) (~~LOC~~)
75.0000 mL/h | INTRAVENOUS | Status: DC
Start: 2016-06-09 — End: 2016-06-12

## 2016-06-09 MED ORDER — LACTATED RINGERS IV SOLN
Freq: Once | INTRAVENOUS | Status: AC
Start: 2016-06-09 — End: 2016-06-10

## 2016-06-09 MED ORDER — PRENATABS RX 29-1 MG PO TABS
1.0000 | ORAL_TABLET | Freq: Every day | ORAL | Status: DC
Start: 2016-06-09 — End: 2016-06-09

## 2016-06-09 MED ORDER — OXYTOCIN 30 UNITS/500 ML LR LV (PREMIX) (~~LOC~~)
75.0000 mL/h | INTRAVENOUS | Status: DC
Start: 2016-06-09 — End: 2016-06-09

## 2016-06-09 MED ORDER — DEXTROSE 50 % IV SOLN
12.5000 mL | INTRAVENOUS | Status: DC | PRN
Start: 2016-06-09 — End: 2016-06-12

## 2016-06-09 MED ORDER — HYDROCODONE-ACETAMINOPHEN 10-325 MG OR TABS
1.0000 | ORAL_TABLET | ORAL | Status: DC | PRN
Start: 2016-06-09 — End: 2016-06-09

## 2016-06-09 MED ORDER — BUPIVACAINE IN DEXTROSE 0.75-8.25 % IT SOLN
INTRATHECAL | Status: DC | PRN
Start: 2016-06-09 — End: 2016-06-09
  Administered 2016-06-09 (×2): 1.6 mL via INTRATHECAL

## 2016-06-09 MED ORDER — MEASLES, MUMPS & RUBELLA VAC SC INJ
0.5000 mL | INJECTION | SUBCUTANEOUS | Status: DC
Start: 2016-06-09 — End: 2016-06-12
  Filled 2016-06-09: qty 0.5

## 2016-06-09 MED ORDER — DOCUSATE SODIUM 250 MG OR CAPS
250.0000 mg | ORAL_CAPSULE | Freq: Two times a day (BID) | ORAL | Status: DC
Start: 2016-06-09 — End: 2016-06-12
  Administered 2016-06-09 – 2016-06-12 (×7): 250 mg via ORAL
  Filled 2016-06-09 (×6): qty 1

## 2016-06-09 MED ORDER — DOCUSATE SODIUM 250 MG OR CAPS
250.0000 mg | ORAL_CAPSULE | Freq: Two times a day (BID) | ORAL | Status: DC
Start: 2016-06-09 — End: 2016-06-09

## 2016-06-09 MED ORDER — DEXTROSE IN LACTATED RINGERS 5 % IV SOLN
INTRAVENOUS | Status: DC
Start: 2016-06-09 — End: 2016-06-12

## 2016-06-09 MED ORDER — DEXTROSE 10 % IV SOLN
50.0000 mL/h | INTRAVENOUS | Status: DC | PRN
Start: 2016-06-09 — End: 2016-06-12

## 2016-06-09 MED ORDER — LACTATED RINGERS IV SOLN
INTRAVENOUS | Status: DC
Start: 2016-06-09 — End: 2016-06-09
  Administered 2016-06-09: 07:00:00 via INTRAVENOUS

## 2016-06-09 MED ORDER — DIPHENHYDRAMINE HCL 50 MG/ML IJ SOLN
25.0000 mg | Freq: Every evening | INTRAMUSCULAR | Status: DC | PRN
Start: 2016-06-09 — End: 2016-06-09

## 2016-06-09 MED ORDER — PHENYLEPHRINE DILUTION 100 MCG/ML IJ SOLN
INTRAVENOUS | Status: DC | PRN
Start: 2016-06-09 — End: 2016-06-09
  Administered 2016-06-09: 100 ug via INTRAVENOUS
  Administered 2016-06-09: 50 ug via INTRAVENOUS
  Administered 2016-06-09: 100 ug via INTRAVENOUS
  Administered 2016-06-09: 50 ug via INTRAVENOUS
  Administered 2016-06-09 (×3): 100 ug via INTRAVENOUS

## 2016-06-09 MED ORDER — SIMETHICONE 80 MG OR CHEW
80.0000 mg | CHEWABLE_TABLET | Freq: Three times a day (TID) | ORAL | Status: DC
Start: 2016-06-09 — End: 2016-06-12
  Administered 2016-06-09 – 2016-06-12 (×9): 80 mg via ORAL
  Filled 2016-06-09 (×8): qty 1

## 2016-06-09 MED ORDER — GLUCAGON HCL (DIAGNOSTIC) 1 MG IJ SOLR
1.0000 mg | INTRAMUSCULAR | Status: DC | PRN
Start: 2016-06-09 — End: 2016-06-12

## 2016-06-09 MED ORDER — IBUPROFEN 600 MG OR TABS
600.0000 mg | ORAL_TABLET | Freq: Four times a day (QID) | ORAL | Status: DC
Start: 2016-06-09 — End: 2016-06-12
  Administered 2016-06-09 – 2016-06-12 (×11): 600 mg via ORAL
  Filled 2016-06-09 (×10): qty 1

## 2016-06-09 MED ORDER — TETANUS-DIPHTH-ACELL PERTUSSIS 5-2.5-18.5 LF-MCG/0.5 IM SUSP
0.5000 mL | Freq: Once | INTRAMUSCULAR | Status: DC
Start: 2016-06-09 — End: 2016-06-09

## 2016-06-09 MED ORDER — DEXTROSE 50 % IV SOLN
50.0000 mL | INTRAVENOUS | Status: DC | PRN
Start: 2016-06-09 — End: 2016-06-12

## 2016-06-09 MED ORDER — PRENATABS RX 29-1 MG PO TABS
1.0000 | ORAL_TABLET | Freq: Every day | ORAL | Status: DC
Start: 2016-06-10 — End: 2016-06-09

## 2016-06-09 MED ORDER — OXYTOCIN 30 UNITS/500 ML LR LV (PREMIX) (~~LOC~~)
500.0000 mL/h | INTRAVENOUS | Status: AC
Start: 2016-06-09 — End: 2016-06-09
  Administered 2016-06-09 (×2): 500 mL via INTRAVENOUS

## 2016-06-09 MED ORDER — HYDROCODONE-ACETAMINOPHEN 5-325 MG OR TABS
1.0000 | ORAL_TABLET | ORAL | Status: DC | PRN
Start: 2016-06-09 — End: 2016-06-12
  Administered 2016-06-10 – 2016-06-12 (×5): 1 via ORAL
  Filled 2016-06-09 (×4): qty 1

## 2016-06-09 MED ORDER — SOD CITRATE-CITRIC ACID 500-334 MG/5ML OR SOLN
30.0000 mL | Freq: Once | ORAL | Status: AC
Start: 2016-06-09 — End: 2016-06-09
  Administered 2016-06-09 (×2): 30 mL via ORAL
  Filled 2016-06-09: qty 30

## 2016-06-09 MED ORDER — SIMETHICONE 80 MG OR CHEW
80.0000 mg | CHEWABLE_TABLET | Freq: Three times a day (TID) | ORAL | Status: DC
Start: 2016-06-09 — End: 2016-06-09

## 2016-06-09 MED ORDER — CARBOPROST TROMETHAMINE 250 MCG/ML IM SOLN
250.0000 ug | Freq: Once | INTRAMUSCULAR | Status: DC | PRN
Start: 2016-06-09 — End: 2016-06-12

## 2016-06-09 MED ORDER — LIDOCAINE HCL 1 % IJ SOLN
1.0000 mL | Freq: Once | INTRAMUSCULAR | Status: DC | PRN
Start: 2016-06-09 — End: 2016-06-09

## 2016-06-09 MED ORDER — DIPHENHYDRAMINE HCL 25 MG OR TABS OR CAPS
25.0000 mg | ORAL_CAPSULE | Freq: Every evening | ORAL | Status: DC | PRN
Start: 2016-06-09 — End: 2016-06-12

## 2016-06-09 MED ORDER — LACTATED RINGERS IV SOLN
INTRAVENOUS | Status: DC
Start: 2016-06-09 — End: 2016-06-09

## 2016-06-09 MED ORDER — DIPHENHYDRAMINE HCL 50 MG/ML IJ SOLN
25.0000 mg | Freq: Every evening | INTRAMUSCULAR | Status: DC | PRN
Start: 2016-06-09 — End: 2016-06-12

## 2016-06-09 MED ORDER — ONDANSETRON HCL 4 MG/2ML IV SOLN
4.0000 mg | INTRAMUSCULAR | Status: DC | PRN
Start: 2016-06-09 — End: 2016-06-12

## 2016-06-09 MED ORDER — DEXTROSE 50 % IV SOLN
25.00 mL | INTRAVENOUS | Status: DC | PRN
Start: 2016-06-09 — End: 2016-06-12

## 2016-06-09 MED ORDER — CEFAZOLIN SODIUM 1 GM IJ SOLR
INTRAMUSCULAR | Status: DC | PRN
Start: 2016-06-09 — End: 2016-06-09
  Administered 2016-06-09 (×2): 2000 mg via INTRAVENOUS

## 2016-06-09 MED ORDER — CARBOPROST TROMETHAMINE 250 MCG/ML IM SOLN
250.0000 ug | INTRAMUSCULAR | Status: DC | PRN
Start: 2016-06-09 — End: 2016-06-09

## 2016-06-09 MED ORDER — DIPHENHYDRAMINE HCL 25 MG OR TABS OR CAPS
25.0000 mg | ORAL_CAPSULE | Freq: Every evening | ORAL | Status: DC | PRN
Start: 2016-06-09 — End: 2016-06-09

## 2016-06-09 MED ORDER — LACTATED RINGERS IV SOLN
INTRAVENOUS | Status: DC
Start: 2016-06-09 — End: 2016-06-12

## 2016-06-09 MED ORDER — SENNA 8.6 MG OR TABS
8.6000 mg | ORAL_TABLET | Freq: Two times a day (BID) | ORAL | Status: DC
Start: 2016-06-09 — End: 2016-06-12
  Administered 2016-06-09 – 2016-06-12 (×7): 8.6 mg via ORAL
  Filled 2016-06-09 (×6): qty 1

## 2016-06-09 MED ORDER — LIDOCAINE HCL 2 % EX GEL (UROJET) (~~LOC~~)
5.0000 mL | Freq: Once | CUTANEOUS | Status: DC
Start: 2016-06-09 — End: 2016-06-09
  Filled 2016-06-09: qty 5

## 2016-06-09 MED ORDER — LACTATED RINGERS IV SOLN
INTRAVENOUS | Status: DC | PRN
Start: 2016-06-09 — End: 2016-06-09
  Administered 2016-06-09 (×3): via INTRAVENOUS

## 2016-06-09 MED ORDER — CEFAZOLIN SODIUM-DEXTROSE 2-4 GM/100ML-% IV SOLN
2000.0000 mg | Freq: Once | INTRAVENOUS | Status: AC
Start: 2016-06-09 — End: 2016-06-09
  Administered 2016-06-09 (×2): 2000 mg via INTRAVENOUS
  Filled 2016-06-09: qty 2

## 2016-06-09 MED ORDER — FERROUS SULFATE 325 (65 FE) MG OR TABS
325.0000 mg | ORAL_TABLET | Freq: Every day | ORAL | Status: DC
Start: 2016-06-10 — End: 2016-06-09

## 2016-06-09 SURGICAL SUPPLY — 42 items
APPLICATOR CHLORAPREP 26ML, ~~LOC~~ (Misc Surgical Supply) IMPLANT
BAG INFANT RESUSCITATION DISP (Anesthesia Supply) IMPLANT
BENZOIN TINCTURE AMPULE STERILE (Misc Medical Supply) IMPLANT
BLADE SURGEON #10 STERILE (Knives/Blades) IMPLANT
BLADE SURGEON #15 STERILE (Knives/Blades) IMPLANT
CANISTER SUCTION 1200CC (Misc Medical Supply) IMPLANT
CLAMP CORD SINGLE STERILE (Misc Medical Supply) IMPLANT
CLIPPER BLADE ASSY FOR 9670 CLIPPER (Knives/Blades) IMPLANT
CONTAINER PATH 64 OZ W/LID (Misc Medical Supply) IMPLANT
DRAPE HALF SHEET MEDIUM (Drapes/towels) IMPLANT
DRESSING MEPILEX BORDER SILICONE 4" X 10" (Dressings/packing) IMPLANT
DRESSING SPONGE SOFT 4X4 (Dressings/packing) IMPLANT
DRESSING TELFA STRL 3" X 8" (Dressings/packing) IMPLANT
GLOVE SURGEON BIOGEL SIZE 7 (Gloves/gowns) IMPLANT
GOWN SURGICAL ULTRA FABRIC- REINFORCED, XL BLUE (Gloves/gowns) IMPLANT
KIT BLD GAS 3CC 22GX1 SAFETY (Kits/Sets/Trays) IMPLANT
MANIFOLD 4 PORT STANDARD (Misc Medical Supply)
MANIFOLD 4 PORT STANDARD, NEPTUNE SUCTION (Misc Medical Supply) IMPLANT
MASK INFANT SEALFLEX (Anesthesia Supply) IMPLANT
PACK C-SECTION CUSTOM PHS (Procedure Packs/kits) IMPLANT
PACK C-SECTN BIRTH W/DRAPE (Procedure Packs/kits) IMPLANT
PAD GROUND VALLEYLAB REM ADULT E7507 (Misc Surgical Supply) IMPLANT
PEN MARKER II SECURLINE BLACK (Misc Medical Supply) IMPLANT
PENCIL ELECTROSURG W/BL&CTRL (Cautery) IMPLANT
PREP TRAY WET SKIN SCRUB KENDALL (Misc Surgical Supply) IMPLANT
SKIN PREP -BATH CLOTH CHG 2% (Misc Medical Supply) IMPLANT
SLEEVE SCD KNEE LG (Misc Medical Supply) IMPLANT
SLEEVE SCD KNEE MEDIUM (Misc Medical Supply) IMPLANT
SOLUTION IRR POUR BTL 0.9% NS 1000ML (Non-Pharmacy Meds/Solutions) IMPLANT
SOLUTION IRR POUR BTL H20 1000ML (Non-Pharmacy Meds/Solutions) IMPLANT
SPONGE LAP RF DETECT 18" X 18" XRAY STERILE (Dressings/packing) IMPLANT
STAPLER PROXIMATE SKIN 35 WIDE (Staplers and staple reloads) IMPLANT
STRIP MEDI-STRIP SKIN CLOSURE 1/2 X 4" (Dressings/packing) IMPLANT
STRIP SKIN CLOSURE 1/2 X 4 (Dressings/packing)
SUTURE CHROMIC GUT 0 36" CT 914 (Suture) IMPLANT
SUTURE VICRYL 2-0 CT-1 36" J945 (Suture) IMPLANT
SUTURE VICRYL 4-0 27" KS (Suture) IMPLANT
SUTURE VICRYL PLUS 0 36" CT-1 VCP946 (Suture) IMPLANT
SUTURE VICRYL PLUS 0 36" CTX-B VCPB978 (Suture) IMPLANT
SUTURE VICRYL PLUS 2-0 27" CT-1 VCPP42D (Suture) IMPLANT
TOWEL BLUE STERILE DISPOSABLE (Drapes/towels) IMPLANT
TRAY FOLEY SURESTEP LUBRI-SIL I.C.16FR URIMETER, LF (Lines/Drains) IMPLANT

## 2016-06-09 NOTE — Anesthesia Postprocedure Evaluation (Signed)
Anesthesia Post Note    Patient: Erin FellersRachel Holle Harrington    Procedure(s) Performed: Procedure(s):  CESAREAN SECTION      Final anesthesia type: Spinal    Patient location: PACU    Post anesthesia pain: adequate analgesia    Mental status: awake, alert  and oriented    Airway Patent: Yes    Last Vitals:   Vitals:    06/09/16 1250   BP: 121/63   Pulse: 77   Resp: 18   Temp:    SpO2: 97%       Post vital signs: stable    Hydration: adequate    N/V:no    Were two or more anti-emetics used?: Yes    Disposal of controlled substances: All controlled substances during the case accounted for and disposed of per hospital policy    Plan of care per primary team.

## 2016-06-09 NOTE — Anesthesia Procedure Notes (Signed)
Epidural/Spinal Block Procedure Note  Date/Time:  Date & Time: 06/09/2016 10:14 AM   Universal Protocol:  Universal Protocol: Verbal consent obtained, risks and benefits discussed, patient states understanding of the procedure being performed, the patient's understanding of the procedure matches consent given, procedure consent matches procedure scheduled, relevant documents present and verified and required blood products, implants, devices, and special equipment available  Consent given by: patient  Patient identity confirmed by: verbally with patient, arm band, provided demographic data and hospital-assigned identification number   Procedure:    Procedure type: Spinal block  Sterile skin prep: Chlorehexadine (Chloraprep)  Patient position: Sitting  Level: Lumbar  Approach: Midline  Interspace: L2-3    Needle gauge used to anesthetize site: 25G  Needle gauge used to perform procedure: 27g  Insertion attempts: 2  no Ane Parasthesia Yes/No    epidural CSF                         Comments:

## 2016-06-09 NOTE — Interdisciplinary (Signed)
Social Work Assessment, Adult     Patient Name:  Erin Harrington   MRN: [id number]   Date of Birth: 05/10/1985    Age: 31 year old   Date of Admission:  06/09/2016         Service Date: June 09, 2016     Assessment  Assessment Type: Initial   Referral Information  Referral Type: Other (Comment) (Resources)       Clinical Summary:    CSW received a consult for this OB pt on 06/09/16. CSW contacted RN Jacki ConesLaurie to clarify the consult. She stated that pt has a h/o depression from her first pregnancy. Pt is s/p c-section and currently in recovery. Will f/u at a later time.         Carmelina PaddockStacey Jenna Ardoin, LCSW     Date: 06/09/2016    Time: 2:29 PM

## 2016-06-09 NOTE — Anesthesia Postprocedure Evaluation (Signed)
Anesthesia Transfer of Care Note    Patient: Erin Harrington  Procedure(s) Performed: Procedure(s):  CESAREAN SECTION    Patient location: PACU    Post pain: adequate analgesia    Post assessment: no apparent anesthetic complications and tolerated procedure well    Post vital signs: stable    Level of consciousness: awake, alert  and oriented    Complications: None

## 2016-06-09 NOTE — Progress Notes (Signed)
HISTORY & PHYSICAL - INTERVAL ASSESSMENT    **ONLY TO BE USED IN ADDITION TO A HISTORY & PHYSICAL**    Erin Harrington  [id number]      This interval assessment is required for History & Physical completed less than 30 days prior to the admission or surgery. A History & Physical completed more than 30 days prior to the admission or surgery must be repeated.    Current Medical Status:  Unchanged    Medications / Allergies:  Unchanged    Review of Systems:  Unchanged    Physical Examination:  I have examined the patient today.  Unchanged    Laboratory or Clinical Data:  Unchanged    Modifications of Initial Care Plan:  Unchanged    Erin MurdochBriana Harrington     06/09/16     6:52 AM

## 2016-06-09 NOTE — Plan of Care (Signed)
Problem: Promotion of Perioperative Health and Safety  Goal: Promotion of Health and Safety of the Perioperative Patient  The patient remains safe, receives treatment appropriate to the surgical intervention and patient's physiological needs and is discharged or transferred to the appropriate level of care.  Outcome: Progressing toward goal, anticipate improvement over: next 12-24 hours   06/09/16 0800   Perioperative Plan of Care   [facility]   Outcome Evaluation (rationale for progressing/not progessing Vital signs will remain stable, Abdominal dressing witll remain dry and intact   Patient Specific Goal, Promotion of Perioperative Health & Safety Decrease vaginal beeding, Fundal checks, abdominal dressing to remain dry and intact   Additional Individualized Interventions/Recommendations  Assist with breastfeeding

## 2016-06-09 NOTE — Anesthesia Preprocedure Evaluation (Addendum)
ANESTHESIA PRE-OPERATIVE EVALUATION    Patient Information    Name: Erin Harrington    MRN: [id number]    DOB: October 24, 1985    Age: 31 year old    Sex: female  Procedure(s):  CESAREAN SECTION      LMP 09/15/2015        Primary language spoken:  English    ROS/Medical History:      General Review & History of Anesthetic Complications:        Previous Cesarean section x1 Cardiovascular:  - negative ROS        ROS comment: PIH on her previous pregnancy           Pulmonary:  - negative ROS    Hematology/Oncology:   - negative ROS   Neuro/Psych:   - negative ROS        Infectious Disease:   Negative ROS         Endo/Other:        ROS Comments: Morbid obesity GI/Hepatic:  - negative ROS    Renal:    - Negative ROS        Pregnancy History:  Para: 1  Gravida: 2  Currently pregnant? yes  Pregnancy history comments: G2P1 previous cesarean section, [redacted] weeks gestation     Pre Anesthesia Testing (PCC/CPC) notes/comments:     by [facility] provider    31 year old G2P1001. She is scheduled for repeat cesarean section on 06/09/16 at 57w0dgestation, indication is repeat and breech. 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, Rh negative, h/o LTCS x1, and h/o PIH in previous pregnancy.:  :                    Physical Exam    Airway:  Inter-inciser distance > 4 cm  Prognanth Able    Mallampati: II  Neck ROM: full  TM distance: > 6 cm  Short thick neck: No        Cardiovascular:  - cardiovascular exam normal         Pulmonary:  - pulmonary exam normal           Neuro/Neck/Skeletal/Skin:  - [place] ANE PHYS EXAM NEGATIVE ROS SKIN SKELETAL NEURO NECK          Dental:  - normal exam    Abdominal:      General: obesity     Additional Clinical Notes:               Last  OSA (STOP BANG) Score:  No Data Recorded    Last OSA  (STOP) Score for   No Data Recorded                 No past medical history on file.  Past Surgical History:   Procedure Laterality Date    CESAREAN SECTION, LOW TRANSVERSE  2015     Social History   Substance Use Topics     Smoking status: Former Smoker     Types: Cigarettes     Start date: 2008     Quit date: 2009    Smokeless tobacco: Never Used    Alcohol use No       No current facility-administered medications for this encounter.      No Known Allergies    Labs and Other Data  Lab Results   Component Value Date    K 3.4 06/04/2016    CL 109 06/04/2016    BUN 7 06/04/2016  CREAT 0.5 06/04/2016    GLU 101 06/04/2016    [place] 8.7 06/04/2016     Lab Results   Component Value Date    AST 15 06/04/2016    ALT 12 06/04/2016    ALK 169 06/04/2016    ALB 3.2 06/04/2016    TBILI 0.5 06/04/2016     Lab Results   Component Value Date    RBC 4.01 06/04/2016    HGB 11.9 06/04/2016    HCT 35.3 06/04/2016    MCV 88.0 06/04/2016    MCHC 33.7 06/04/2016    RDW 13.5 06/04/2016     No results found for: INR, PTT  No results found for: ARTPH, ARTPO2, ARTPCO2    Anesthesia Plan:  Risks and Benefits of Anesthesia  I personally examined the patient immediately prior to the anesthetic and reviewed the pertinent medical history, drug and allergy history, laboratory and imaging studies and consultations. I have determined that the patient has had adequate assessment and testing.    Anesthetic techniques, invasive monitors, anesthetic drugs for induction, maintenance and post-operative analgesia, risks and alternatives have been explained to the patient and/or patient's representatives.    I have prescribed the anesthetic plan:         Planned anesthesia method: General and Spinal         ASA 2 (Mild systemic disease)     Potential anesthesia problems identified and risks including but not limited to the following were discussed with patient and/or patient's representative: Adverse or allergic drug reaction, Administration of blood products, Ocular injury and Dental injury or sore throat    Planned monitoring method: Routine monitoring    Informed Consent:  Anesthetic plan and risks discussed with Patient and Spouse/Registered Soil scientist.   Use of blood products discussed with patient and spouse whom.         Plan discussed with Resident.

## 2016-06-09 NOTE — Plan of Care (Signed)
Problem: Promotion of Perioperative Health and Safety  Goal: Promotion of Health and Safety of the Perioperative Patient  The patient remains safe, receives treatment appropriate to the surgical intervention and patient's physiological needs and is discharged or transferred to the appropriate level of care.   Outcome: Progressing toward goal, anticipate improvement over: next 12-24 hours      Comments: Nursing Shift Summary    No data found.    Remains stable, VSS, afebrile, abd dressing dry and intact, FF@u , scant lochia, foley to drainage

## 2016-06-09 NOTE — Procedures (Signed)
OPERATIVE NOTE     Name: Erin Harrington    Date: 06/09/2016    Preoperative Diagnosis:   1. IUP @ [redacted]w[redacted]d   2. Breech presentation  3. Previous c/s  X 1  4. Bicornuate uterus    Postoperative Diagnosis: Same     Operation: repeat low transverse cesarean section     Surgeon: Felton Clinton (A)    Assistant: Arvil Persons, Liu R2    Anesthesia: regional    Complications: none    Quantitated Blood loss: 350 ml     Fluids: 1.2 ml     Urine Ouput: 500 ml     Specimens: none    Indications: The patient is a 31 year old yo G2P1001 @ [redacted]w[redacted]d weeks gestational age who presented to L&D for scheduled repeat cesarean section    Findings: Female  infant in breech presentation with weight: 3580 g (7 lb 14.3 oz) . Apgars: 1 min 8 ; 5 min 9 . Clear amniotic fluid. No nuchal cord. Placenta with three-vessel cord and intact cotyledons. Bicornuate uterus with a rudimentary horn noted on the right (one cervix) and normal appearing bilateral ovaries and tubes.     Procedure in Detail:   Risks, benefits, and alternatives to the procedure were discussed in detail, and the patient desired to proceed. Consents were signed and placed in the chart. The patient was given Ancef and Bicitra.. She was taken to the operating room where regional anesthesia was administered and found to be adequate. The bladder was drained with a foley catheter. SCD's were placed. She was prepped and draped in the normal sterile fashion in dorsal supine position with a leftward tilt. A time out was held. The patient, procedure, and all participants were verified.     A Pfannenstiel skin incision was made with the scalpel and carried through the subcutaneous tissue to the fascia. The fascia was nicked in the midline, and the incision sharply extended. The superior aspect of the fascial incision was elevated and the underlying rectus sharply dissected off. The inferior aspect of the fascial incision was then elevated and the  rectus sharply dissected off. The rectus muscles were then separated in the midline with kelly clamps and with Metzenbaum scissors the peritoneal layer was exposed taking care to flash through each layer, and the peritoneum identified and entered sharply.     The bladder blade was then inserted and the lower uterine segment sharply incised in a transverse fashion. The uterine cavity was ultimately entered sharply. The uterine incision was then extended bluntly with amnion intact. The bladder blade was removed, and the infant's feet were grasped in the process breaking the amnion. The body was wrapped in a warm moist towel, then delivered to the level of the scapula. The right arm was gently swept outside of the uterus and then the left. The body was then extended up and then head was flexed for delivery. The nose and mouth were suctioned on the field, and the cord clamped and cut after one minute. The infant was handed off to NICU. The placenta was then extracted and sent to pathology. The uterus was exteriorized and cleared of all clots and debris and the pregnancy was noted to be in the left horn of the bicornuate uterus. The uterine incision was repaired in 2 layers, the first with running locked 0 chromic and the second  a horizontal imbricating layer of 0 chromic. The hysterotomy was then hemostatic.    The uterus was then returned to  the abdomen. The gutters were cleared of all clots. Released from tension, the uterine incision was then re-examined and found to be hemostatic.  The fascia was repaired with running 0 vicryl. The subcutaneous tissue was irrigated, made hemostatic with the bovie, and reapproximated with 2-0 vicryl. The skin was closed with 4-0 monocryl.     Sponge, lap, and needle counts were correct times two. The patient tolerated the procedure well and was dismissed from the operating room and taken to recovery in stable condition.    OB Attending:   I was present for the critical portions of the surgery; entry, delivery of the baby and placenta, closure of the hysterotomy and fascia.  No complications.  Pt tolerated the procedure well.  All of my edits to the resident delivery note have been made within the document above.  Erin LowVasiliki Dontravious Camille, MD   [facility]  [employer] [place] [place]

## 2016-06-10 ENCOUNTER — Inpatient Hospital Stay: Payer: Commercial Managed Care - PPO

## 2016-06-10 DIAGNOSIS — N2889 Other specified disorders of kidney and ureter: Secondary | ICD-10-CM

## 2016-06-10 LAB — HEMOGRAM, BLOOD
Hematocrit: 32.1 % — ABNORMAL LOW (ref 34.0–44.0)
Hgb: 10.7 G/DL — ABNORMAL LOW (ref 11.5–15.0)
MCH: 29.5 PG (ref 27.0–33.5)
MCHC: 33.3 G/DL (ref 32.0–35.5)
MCV: 88.5 FL (ref 81.5–97.0)
MPV: 9 FL (ref 7.2–11.7)
PLT Count: 165 10*3/uL (ref 150–400)
RBC: 3.62 10*6/uL — ABNORMAL LOW (ref 3.70–5.00)
RDW-CV: 13.6 % (ref 11.6–14.4)
White Bld Cell Count: 8.5 10*3/uL (ref 4.0–10.5)

## 2016-06-10 LAB — MRSA SCREEN CULTURE
Culture Result: NEGATIVE
Culture Result: NEGATIVE

## 2016-06-10 NOTE — Plan of Care (Signed)
Problem: Promotion of Health and Safety of the Perinatal Patient  Goal: Promotion of Health and Safety of the Perinatal Patient  OB Patient safety and health maintained and promoted throughout her perinatal experience without compromise and/or demonstrates improvement in physical, psychoscocial and/or functional status in maternal or fetal/infant well-being from admission to discharge. Patient and/or family demonstrates alleviation of physical, emotional and/or spiritual suffering while facing perinatal loss from admission to discharge  Outcome: Progressing toward goal, anticipate improvement over: >48 hours   06/10/16 0637   Perinatal Plan of Care   Standard of Care/Policy Postpartum SOC   Outcome Evaluation (rationale for progressing/not progessing) Pain management   Patient Specific Goal, Perinatal Patient Vital signs to remain stable, continue to exclusively breastfeed   Additional Individualized Interventions/Recommendations Encourage ambulation   Additional Individualized Interventions/Recommendations Encourage fluid intake     Nursing Shift Summary    Provider Notification for the past 12 hrs:   Provider Name Reason for Communication Provider Response   06/10/16 0445 Dr. Myrtie SomanZwerling R1 Dr. Myrtie SomanZwerling at nursing station; notified of preliminary kidney US results and that infant is RhNeg.  Can cancel rhogam studies.        No data found.      No data found.    Shift Comments for the past 12 hrs:   Comments   06/09/16 2019 IVF stopped, patient tolerating clear liquid diet and drinking lots of fluid. Educated patient regarding need to increase PO fluid intake; verbalizes understanding. Patient given a couple packets of crackers to advance diet.    06/10/16 0620 Foley catheter removed, pericare and CHG wipes performed.   06/10/16 [id number] Patient ambulated once around the unit with RN standby assist.

## 2016-06-10 NOTE — Progress Notes (Signed)
Postpartum Progress Note  Hospital Days:   1 day - Admitted on: 06/09/2016  PostOp Day: 1 Day Post-Op    Subjective  No acute events overnight. Patient's pain well-controlled with PO medications. Not yet tolerating regular diet, or ambulating on the floor. Foley catheter in place. - flatus, - BM. Lochia appropriate. Breastfeeding without difficulty. Baby at bedside.     Physical exam  Temperature:  [97.7 F (36.5 C)-99.1 F (37.3 C)] 98.6 F (37 C) (02/21 0005)  Blood pressure (BP): (98-124)/(58-86) 117/86 (02/21 0005)  Heart Rate:  [72-102] 78 (02/20 1910)  Respirations:  [16-24] 16 (02/21 0217)  Pain Score: NA (pre med, non-pain or scheduled) (02/21 0241)  O2 Device: None (Room air) (02/21 0005)  SpO2:  [95 %-99 %] 99 % (02/21 0005)   Height: 5\' 6"  (167.6 cm)  Weight: 101.8 kg (224 lb 5.1 oz)  Body mass index is 36.21 kg/(m^2).    General Appearance: no acute distress  Heart:  normal rate and regular rhythm, no murmurs, clicks, or gallops  Lungs: clear to auscultation bilaterally  Abdomen: Soft, nontender, normal bowel sounds present, fundus firm at umbilicus, incision c/d/i  GU: foley draining clear urine  Extremities: No cyanosis, clubbing and No edema    I&O    Intake/Output Summary (Last 24 hours) at 06/10/16 0250  Last data filed at 06/10/16 0241   Gross per 24 hour   Intake             2875 ml   Output             4850 ml   Net            -1975 ml       CURRENT LABS  Admission on 06/09/2016   Component Date Value Ref Range Status   . Blood Component Type 06/09/2016 COMPONENT GROUP FOR RED CELLS   Final   . Units Ordered 06/09/2016 2   Final   . ABO/Rh(D) 06/09/2016 O NEGATIVE   Final   . Antibody Screen Result 06/09/2016 NEGATIVE   Final   . Crossmatch Expires 06/09/2016 06/12/2016   Final   . Bld Bank Comm 06/09/2016    Final                    Value:THE FOLLOWING INFORMATION APPLIES TO WOMEN OF CHILD-BEARING AGE:  STATE LAW REQUIRES THAT THE WOMAN TESTED BE INFORMED AS TO THE RHESUS (RH) TYPING TEST  RESULTS     . White Bld Cell Count 06/09/2016 9.1  4.0 - 10.5 THOUS/MCL Final   . RBC 06/09/2016 4.19  3.70 - 5.00 MILL/MCL Final   . Hgb 06/09/2016 12.2  11.5 - 15.0 G/DL Final   . Hematocrit 16/01/9603 37.1  34.0 - 44.0 % Final   . MCV 06/09/2016 88.4  81.5 - 97.0 FL Final   . MCH 06/09/2016 29.2  27.0 - 33.5 PG Final   . MCHC 06/09/2016 33.0  32.0 - 35.5 G/DL Final   . RDW-CV 54/12/8117 13.8  11.6 - 14.4 % Final   . PLT Count 06/09/2016 222  150 - 400 THOUS/MCL Final   . MPV 06/09/2016 9.7  7.2 - 11.7 FL Final       IMAGING: none    Current Inpatient Medications:  . diphtheria-acellular pertussis-tetanus vaccine  0.5 mL Once   . docusate sodium  250 mg BID   . ferrous sulfate  325 mg Daily   . ibuprofen  600 mg Q6H   .  bolus IV fluid   Once   . measles-mumps-rubella vaccine  0.5 mL Prior to discharge   . prenatal vitamin-folic acid  1 tablet Daily   . senna  8.6 mg BID   . simethicone  80 mg TID     . dextrose 10%     . dextrose 5%-lactated ringers     . lactated ringers     . lactated ringers Stopped (06/09/16 2030)   . oxytocin (PITOCIN) 30 Units in lactated ringers 500 mL       . carboprost  250 mcg Once PRN   . dextrose 10%  50 mL/hr Continuous PRN   . dextrose  25 mL PRN   . dextrose  50 mL Q15 Min PRN   . dextrose  12.5 mL Q15 Min PRN   . diphenhydrAMINE  25 mg Nightly PRN    Or   . diphenhydrAMINE  25 mg Nightly PRN   . Glucagon HCl (Diagnostic)  1 mg Q15 Min PRN   . HYDROcodone-acetaminophen  1 tablet Q4H PRN   . HYDROcodone-acetaminophen  2 tablet Q4H PRN   . LAN-O-SOOTHE   PRN   . methylergonovine  0.2 mg Once PRN   . misoprostol  1,000 mcg Once PRN   . ondansetron  4 mg Q4H PRN       Assessment/Plan:  30yo G2P2002 s/p RLTCS at 39 0/7wks, admitted for elective repeat c-section, breech presentation.    # POD 1: doing well. Meeting postpartum milestones. Continue routine postpartum care.  - advance diet as appropriate  - encourage ambulation  - voiding appropriately  - encourage breastfeeding  [ ]  f/u AM  CBC    # Hx C/S x2    # Bicornuate uterus  [x]  renal US - prelim read mild R pelviectasis    # Rh negative - baby Rh neg    # Body mass index is 36.21 kg/(m^2).    # Circumcision: declines    # Contraception: fertility awareness/condoms    # Disposition: anticipate POD3-4    Discussed with Dr. Melina SchoolsFitzmaurice, attending physician.    Cherlynn PoloBlake Revia Nghiem, MD MSc  [facility] PGY[facility]    For questions or concerns, please call the L&D R1 phone: [phone].

## 2016-06-10 NOTE — Interdisciplinary (Signed)
Social Work Assessment, Adult      Patient Name:  Erin Harrington   MRN: [id number]   Date of Birth: 1986/01/02    Age: 31 year old   Date of Admission:  06/09/2016      Service Date: June 10, 2016     Assessment  Assessment Type: Initial   Referral Information  Referral Type: Other (Comment) (Resources, h/o postpartum depression)   Social Assessment  Mode of Arrival: Car  Prior to Level of Function: Ambulatory/Independent with ADL's  Primary Caretaker(s): Spouse  Primary Contact Name: Sharisa Toves  Primary Contact Number: [phone]  Primary Contact Relationship: Spouse  Permission to Contact: Yes  Interpreter Used?: Not Needed  Literacy: Can read;Can write     Social Determinates of Health  Living Arrangements: Spouse / significant other  Support Systems: Family member(s);Friends / neighbors;Spouse / significant other  Type of Residence: Private residence  [place]: No  Has discharge transport been arranged?: Yes  Transportation: Geologist, engineering in Discharge Planning: Yes    Income Information  Income Source:  Water engineer)    [place]  Past Mental Health Issues: H/o postpartum anxiety  Mental Status: No issues  Behavioral Assessment: No issues  Physical Assessment: No issues    Referral To  Community Resources:  ([place] postpartum wellness program, Postparrtum support international)    Plans/Interventions/Discharge  Anticipated Discharge Destination: Home    Clinical Summary:    Pt is a 31yo G2P2 who is s/p c-section delivery. Pt has a h/o pregnancy inducted HTN and bicornate uterus. Also with h/o postpartum depression/anxiety.     CSW met with pt and husband and introduced self/role. They were receptive and engaging. Both pt and husband were attentive and caring toward the baby and equally involved with the care. Pt reported that she has been breastfeeding well so far and was able to speak to the lactation  specialist as well. Husband will have this week off from work and then pt's mother will arrive from [place] next week and stay for a month. Husband's mother will also stay for a month once pt's mother has left. Pt stated that they planned ahead to make sure she would have help for about 31mosince she knew she would be recovering from the c-section and also need help with the 31yo.     CSW assessed her current emotional state and prior hx. Pt stated that she has felt quite well during this pregnancy and did not experience any mood issues or anxiety. Pt stated that she likely had postpartum anxiety after her first child although she did not recognize it at the time. She constantly felt worried and scared that her baby would stop breathing or have SIDS, so this affected her sleep and coping. Pt was never assessed or diagnosed, but in hindsight she realized that she likely had postpartum anxiety and probably should have gotten more help. Pt stated that she feels more aware and prepared this time. She knows that it is important to speak up about her struggles and receive proper tx/help.     CSW praised pt for her insight and perspective from the past experience. CSW discussed the common challenges and struggles that parents of newborns can experience, and how unrealistic pressure and expectations can exacerbate feelings of anxiety, inadequacy and guilt. CSW reminded pt that her well being and stability is essential for the baby and discussed ways she can address any mental health concerns that may rise during her postpartum  course. Pt stated that she took antidepressants for a brief period ten years ago but has not experience any depressive symptoms since then. She has benefited more from therapy rather than meds. CSW provided relevant community resources and she was appreciative.     Pt and husband presented as having a positive, supportive relationship  with one another and bonding well with their baby. Also have a good plan in place for help in the home and were insightful of vulnerabilities. No current issues/needs noted. Will remain available through hospital stay.         Myrtice Lauth, LCSW     Date: 06/10/2016    Time: 12:49 PM

## 2016-06-10 NOTE — Lactation Note (Signed)
This note was copied from a baby's chart.  Lactation Note    BOY  Alene MiresRachel Drumheller  [id number]    Reason for consult:  History of breastfeeding issues, Maternal support and education, Mother request    Breastfeeding experience: Yes 6 months to 1 year  Maternal Risk Factors:        Infant Risk Factors:      Preferred language: English     Mother's Name:  [name],Aslynn HOLLE  30 year old    Mother's MRN: [id number]   Obstetric History    G2   P2   T2   P0   A0   L2     SAB0   TAB0   Ectopic0   Multiple0   Live Births2      The infant was born via C-Section, Low Transverse  Mom's problem list:   Patient Active Problem List   Diagnosis    Supervision of high-risk pregnancy, third trimester    Bicornuate uterus    History of low transverse cesarean section    Rh negative state in antepartum period    History of pregnancy induced hypertension    Delivery by elective cesarean section     Baby Information:  Days of life: 2 days  Gestational Age: 7419w0d  Corrected GA: 3252w1d  Birth Weight: 3580 g (7 lb 14.3 oz)  Birth weight change: -2%   Baby's problem list   Patient Active Problem List   Diagnosis    Newborn       Maternal Exam:  Breast Exam: Both breasts  Breast Surgery:    Breast Surgery Years Ago:    Breast Surgery Incision Location:    Left Breast Shape: Average  Right Breast Shape: Average  Left Nipple Exam: Everts with stimulation, Flat  Right Nipple Exam: Everts with stimulation, Flat  Left Nipple Exam Trauma:    Right Nipple Exam Trauma:    Milk Stage: Colostrum  Milk Stage Colostrum: Minimal (per patient.)  Medications of Concern:    Medications of Concern Compatibility:    Name of Medication:    Non-Compatible Medications and Mother's Milk:    Person Notified:    Recommendation:    Recommendation to discontinue breastmilk:      Infant Exam:  State: Sleepy, Arouses with stimulation  Tone: Appropriate for gestational age  Lips: Normal  Tongue: WNL/cupping   Frenulum:    Jaw Movement: Flutter, Gliding  Chin: WNL     Breast feeding observation  Session: Nutritive  Position: Cross cradle  Rooting: Opens with stimulation  Latch:  (Stimulate nipple to help it be erect.)   Latch with Shield:     Latch without Nipple Shield:    Suck Assessment: Appropriate suck pattern, Tongue sucking, Requires minimal support or stimulation to keep active at breast  Shape of Nipple:    Milk Transfer: Suck assessment, Visible swallowing noted   Test Weight mL/gm transferred:     Assessment: Progressing well, mother/baby needs some support   Positioning/Latch Attempt:    Quality: Good    Pumping  Assessment:  (Encouraged mother to manually express milk to increase interest with baby.)  Type:    Flange Size:    Milk Volume (mL) in 24 hours:    # of pumping session in 24 hours    Pumped volume (mL) per session:    Mom brought log?    Pumping Quality:      Breastfeeding Supplementation       Pacifier Use  Reason  for Pacifier:      Infant Out of Room                   Skin to Skin - Delivery Summary  Initiation Date/Time: 06/09/2016 11:11 AM  With: Father [2];Mother [1]  End Date/Time: 06/09/2016 1:40 PM  Reason Skin to Skin not Initiated:    Breastfeeding Initiated Date/Time: 06/09/2016 12:16 PM    Skin to Skin - Newborn Flowsheet  Skin to Skin  Infant skin to skin: Yes  Mother awake: Yes  Infant position: Head turned to one side, neck straight, nares/mouth visible  RN action: Continue skin to skin    Skin to Skin - NICU Flowsheet       Goal  Comfort breastfeeding, Identifies nutritive suck/swallow assessment, Infant increases swallows with breastfeeding    Plan  Plan: Breastfeed at least 8 or more times in 24 hours (on demand), Offer breast every feed, Offer breast with baby shows interest, Skin to skin  Supplementation Method:    Supplementation Types:      Handouts  Lactation handouts        Follow-up  Next day    Lactation Visit  Lactation visit: Initial  Time spent: 30 minutes  Follow Up Needed: Yes  Follow up Date: 06/11/16   Follow up reason: maternal support and guidance    Lelend Heinecke L. [place] Pou, RN  06/10/2016 12:16 PM

## 2016-06-11 NOTE — Progress Notes (Signed)
Postpartum Progress Note  Hospital Days:   2 days - Admitted on: 06/09/2016  PostOp Day: 1 Day Post-Op    Subjective  No acute events overnight. Patient's pain well-controlled with PO medications.Tolerating regular diet. Ambulating and voiding spontaneously. - flatus, - BM. Lochia appropriate. Breastfeeding without difficulty. Baby at bedside.     Physical exam  Temperature:  [97.9 F (36.6 C)-98.8 F (37.1 C)] 97.9 F (36.6 C) (02/21 2358)  Blood pressure (BP): (105-117)/(57-77) 110/64 (02/21 2358)  Heart Rate:  [77-89] 77 (02/21 2358)  Respirations:  [16-20] 18 (02/21 2358)  Pain Score: 0 (02/21 2358)  SpO2:  [98 %-100 %] 98 % (02/21 2358)   Height: 5\' 6"  (167.6 cm)  Weight: 101.8 kg (224 lb 5.1 oz)  Body mass index is 36.21 kg/(m^2).    Gen: No acute distress, comfortable  CV: Regular rate and rhythm, no murmurs  Pulm: Normal work of breathing, lungs clear to auscultation bilaterally  Breast: Deferred, no complaints   Abd: Bowel sounds present, soft, fundus firm at the umbilicus, appropriately tender to palpation; skin well approximated at site of incision without surrounding erythema, induration, or discharge  Peri: Lochia appropriate  Ext: SCDs in place, no edema, nontender  Psych: Mood stable, affect appropriate      CURRENT LABS  Admission on 06/09/2016   Component Date Value Ref Range Status   . Blood Component Type 06/09/2016 COMPONENT GROUP FOR RED CELLS   Final   . Units Ordered 06/09/2016 2   Final   . ABO/Rh(D) 06/09/2016 O NEGATIVE   Final   . Antibody Screen Result 06/09/2016 NEGATIVE   Final   . Crossmatch Expires 06/09/2016 06/12/2016   Final   . Bld Bank Comm 06/09/2016    Final                    Value:THE FOLLOWING INFORMATION APPLIES TO WOMEN OF CHILD-BEARING AGE:  STATE LAW REQUIRES THAT THE WOMAN TESTED BE INFORMED AS TO THE RHESUS (RH) TYPING TEST RESULTS     . White Bld Cell Count 06/09/2016 9.1  4.0 - 10.5 THOUS/MCL Final   . RBC 06/09/2016 4.19  3.70 - 5.00 MILL/MCL Final   . Hgb  06/09/2016 12.2  11.5 - 15.0 G/DL Final   . Hematocrit 16/01/9603 37.1  34.0 - 44.0 % Final   . MCV 06/09/2016 88.4  81.5 - 97.0 FL Final   . MCH 06/09/2016 29.2  27.0 - 33.5 PG Final   . MCHC 06/09/2016 33.0  32.0 - 35.5 G/DL Final   . RDW-CV 54/12/8117 13.8  11.6 - 14.4 % Final   . PLT Count 06/09/2016 222  150 - 400 THOUS/MCL Final   . MPV 06/09/2016 9.7  7.2 - 11.7 FL Final       IMAGING: none    Current Inpatient Medications:  . docusate sodium  250 mg BID   . ferrous sulfate  325 mg Daily   . ibuprofen  600 mg Q6H   . measles-mumps-rubella vaccine  0.5 mL Prior to discharge   . prenatal vitamin-folic acid  1 tablet Daily   . senna  8.6 mg BID   . simethicone  80 mg TID     . dextrose 10%     . dextrose 5%-lactated ringers     . lactated ringers     . lactated ringers Stopped (06/09/16 2030)   . oxytocin (PITOCIN) 30 Units in lactated ringers 500 mL       .  carboprost  250 mcg Once PRN   . dextrose 10%  50 mL/hr Continuous PRN   . dextrose  25 mL PRN   . dextrose  50 mL Q15 Min PRN   . dextrose  12.5 mL Q15 Min PRN   . diphenhydrAMINE  25 mg Nightly PRN    Or   . diphenhydrAMINE  25 mg Nightly PRN   . Glucagon HCl (Diagnostic)  1 mg Q15 Min PRN   . HYDROcodone-acetaminophen  1 tablet Q4H PRN   . HYDROcodone-acetaminophen  2 tablet Q4H PRN   . LAN-O-SOOTHE   PRN   . methylergonovine  0.2 mg Once PRN   . misoprostol  1,000 mcg Once PRN   . ondansetron  4 mg Q4H PRN       Assessment/Plan:  31yo G2P2002 s/p RLTCS at 39 0/7wks, admitted for elective repeat c-section, breech presentation.    # POD 2: doing well. Meeting postpartum milestones. Continue routine postpartum care.  - advance diet as appropriate  - encourage ambulation  - voiding appropriately  - encourage breastfeeding  - hgb 12.2-->10.7    # Hx C/S x2    # Bicornuate uterus  [x]  renal US - mild R pelviectasis, otherwise wnl    # Rh negative - baby Rh neg    # Body mass index is 36.21 kg/(m^2).    # Circumcision: declines    # Contraception: fertility  awareness/condoms    # Disposition: anticipate POD3-4    Discussed with Dr. Maryruth Hancockaniels-Orellana, attending physician.    Donald SivaBianca Marcella Teren Franckowiak, MD, [facility]  Obstetrics & Gynecology, PGY-1  Pager: [phone]    For questions or concerns, please call the L&D R1 phone: [phone].

## 2016-06-11 NOTE — Progress Notes (Signed)
Postpartum Discharge Summary    Admission Date: 06/09/2016     Discharge Date: 06/12/16     Patient Name: Erin Harrington     Admission Diagnoses:  1. Intrauterine pregnancy at 1360w0d weeks, admitted for elective rLTCS, breech presentation  2. Hx C/S x 1  3. Bicornuate uterus  4. Rh negative  5. Body mass index is 36.21 kg/(m^2).    Discharge Diagnoses:  1. S/p rLTCS at 2160w0d weeks  2-5. Same as above.     Principal Procedure During This Hospitalization (required):   Repeat low transverse cesarean section    Other Procedures Performed During This Hospitalization (required):  None    Complications:  none    Hospital Course (required):  The patient is a 31 year old 892P2002 female at 260w0d who presented to Labor and Delivery for elective repeat low transverse cesarean section, also found to have breech presentation. Please see the admission H&P for full details regarding the patient's admission.     Her intrapartum course was uncomplicated. The patient delivered a viable Female  infant delivered on 06/09/2016  10:51 AM .  Weight: 3580 g (7 lb 14.3 oz) . Apgars: 1 min 8 ; 5 min 9   via  cesarean section.    The patient had an uncomplicated post-partum course. Her baby was found to be Rh- so she did not require Rhogam. By POD#3, pain was well controlled with PO medications.  She was tolerating a general diet, voiding spontaneously, passing flatus, and ambulating.  Vital signs were stable and exam was benign.  She was discharged to home in stable condition under full self-care.    The patient was counseled regarding her options for birth control and desires to use condoms and fertility awareness.    Follow Up appointments:  routine 2 week postoperative wound check  routine 6 week postpartum check       The patient was instructed to maintain pelvic rest for six weeks -- including no tampon insertion, douching, or sexual intercourse. She was informed that she may shower as usual. She was instructed to return to the Emergency  Department or to contact her healthcare provider immediately for any symptoms of nausea, vomiting, fevers, wound separation, wound drainage, heavy vaginal bleeding, or pain.    Discharge Condition (required): Recovering post-partum.    Discharge Diet: Regular    Discharge Medications:     What To Do With Your Medications      CONTINUE taking these medications       Add'l Info    aspirin 81 MG tablet   Take 81 mg by mouth daily.    Refills:  0       PRENATAL GUMMIES/DHA & FA 0.4-32.5 MG Chew    Refills:  0             No Known Allergies    Discharge Disposition: Home.

## 2016-06-11 NOTE — Plan of Care (Addendum)
Problem: Promotion of Health and Safety of the Perinatal Patient  Goal: Promotion of Health and Safety of the Perinatal Patient  OB Patient safety and health maintained and promoted throughout her perinatal experience without compromise and/or demonstrates improvement in physical, psychoscocial and/or functional status in maternal or fetal/infant well-being from admission to discharge. Patient and/or family demonstrates alleviation of physical, emotional and/or spiritual suffering while facing perinatal loss from admission to discharge   Outcome: Progressing toward goal, anticipate improvement over: next 12-24 hours   06/10/16 0637   Perinatal Plan of Care   Outcome Evaluation (rationale for progressing/not progessing) Pain management   Patient Specific Goal, Perinatal Patient Vital signs to remain stable, continue to exclusively breastfeed       Nursing Shift Summary    No data found.    No data found.      No data found.    Shift Comments for the past 12 hrs:   Comments   06/10/16 1930 Received report from B.Williams RN, completed sihift assessment, reviewed POC and goals with pt, call light within reach, will continue to monitor.

## 2016-06-11 NOTE — Lactation Note (Signed)
This note was copied from a baby's chart.  Baby is exclusively breastfeeding with adequate output and 6% weight loss.  Assisted mother with deep latch on and positioning.

## 2016-06-11 NOTE — Plan of Care (Signed)
Problem: Promotion of Health and Safety of the Perinatal Patient  Goal: Promotion of Health and Safety of the Perinatal Patient  OB Patient safety and health maintained and promoted throughout her perinatal experience without compromise and/or demonstrates improvement in physical, psychoscocial and/or functional status in maternal or fetal/infant well-being from admission to discharge. Patient and/or family demonstrates alleviation of physical, emotional and/or spiritual suffering while facing perinatal loss from admission to discharge   Outcome: Progressing toward goal, anticipate improvement over: next 12-24 hours   06/11/16 0900   Perinatal Plan of Care   Standard of Care/Policy Postpartum SOC   Patient Specific Goal, Perinatal Patient Pt will be able to deep latch baby and stay wt exclusive breastfeeding.

## 2016-06-12 ENCOUNTER — Ambulatory Visit: Payer: Self-pay

## 2016-06-12 MED ORDER — HYDROCODONE-ACETAMINOPHEN 5-325 MG OR TABS
1.00 | ORAL_TABLET | ORAL | 0 refills | Status: AC | PRN
Start: 2016-06-12 — End: ?

## 2016-06-12 MED ORDER — FERROUS SULFATE 325 (65 FE) MG OR TABS
325.00 mg | ORAL_TABLET | Freq: Every day | ORAL | 0 refills | Status: AC
Start: 2016-06-12 — End: ?

## 2016-06-12 MED ORDER — IBUPROFEN 600 MG OR TABS
600.00 mg | ORAL_TABLET | Freq: Four times a day (QID) | ORAL | 0 refills | Status: AC
Start: 2016-06-12 — End: ?

## 2016-06-12 MED ORDER — DOCUSATE SODIUM 250 MG OR CAPS
250.00 mg | ORAL_CAPSULE | Freq: Two times a day (BID) | ORAL | 0 refills | Status: AC
Start: 2016-06-12 — End: ?

## 2016-06-12 NOTE — Progress Notes (Signed)
Postpartum Progress Note  Hospital Days:   3 days - Admitted on: 06/09/2016  PostOp Day: 3 Day Post-Op    Subjective  No acute events overnight. Patient's pain well-controlled with PO medications.Tolerating regular diet. Ambulating and voiding spontaneously. - flatus, - BM. Lochia appropriate. Breastfeeding without difficulty. Baby at bedside.     Physical exam  Temperature:  [97.7 F (36.5 C)-98.2 F (36.8 C)] 97.7 F (36.5 C) (02/23 0405)  Blood pressure (BP): (97-119)/(52-68) 113/68 (02/23 0405)  Heart Rate:  [69-83] 69 (02/23 0405)  Respirations:  [18-20] 18 (02/23 0405)  Pain Score: 0 (02/23 0007)  SpO2:  [97 %-99 %] 99 % (02/23 0405)   Height: 5\' 6"  (167.6 cm)  Weight: 101.8 kg (224 lb 5.1 oz)  Body mass index is 36.21 kg/(m^2).    Gen: No acute distress, comfortable  CV: Regular rate and rhythm, no murmurs  Pulm: Normal work of breathing, lungs clear to auscultation bilaterally  Breast: Deferred, no complaints   Abd: Bowel sounds present, soft, fundus firm at the umbilicus, appropriately tender to palpation; skin well approximated at site of incision without surrounding erythema, induration, or discharge  Peri: Lochia appropriate  Ext: SCDs in place, no edema, nontender  Psych: Mood stable, affect appropriate      CURRENT LABS  Admission on 06/09/2016   Component Date Value Ref Range Status    Blood Component Type 06/09/2016 COMPONENT GROUP FOR RED CELLS   Final    Units Ordered 06/09/2016 2   Final    ABO/Rh(D) 06/09/2016 O NEGATIVE   Final    Antibody Screen Result 06/09/2016 NEGATIVE   Final    Crossmatch Expires 06/09/2016 06/12/2016   Final    Bld Bank Comm 06/09/2016    Final                    Value:THE FOLLOWING INFORMATION APPLIES TO WOMEN OF CHILD-BEARING AGE:  STATE LAW REQUIRES THAT THE WOMAN TESTED BE INFORMED AS TO THE RHESUS (RH) TYPING TEST RESULTS      White Bld Cell Count 06/09/2016 9.1  4.0 - 10.5 THOUS/MCL Final    RBC 06/09/2016 4.19  3.70 - 5.00 MILL/MCL Final     Hgb 06/09/2016 12.2  11.5 - 15.0 G/DL Final    Hematocrit 45/40/981102/20/2018 37.1  34.0 - 44.0 % Final    MCV 06/09/2016 88.4  81.5 - 97.0 FL Final    MCH 06/09/2016 29.2  27.0 - 33.5 PG Final    MCHC 06/09/2016 33.0  32.0 - 35.5 G/DL Final    RDW-CV 91/47/829502/20/2018 13.8  11.6 - 14.4 % Final    PLT Count 06/09/2016 222  150 - 400 THOUS/MCL Final    MPV 06/09/2016 9.7  7.2 - 11.7 FL Final    Description 06/09/2016 NASOPHARYNX   Final    Special Info 06/09/2016 NONE   Final    Culture Result 06/09/2016 NEGATIVE for METHICILLIN RESISTANT STAPHYLOCOCCUS AUREUS   Final    Culture Result 06/09/2016 NEGATIVE for Methicillin susceptible STAPHYLOCOCCUS AUREUS   Final    White Bld Cell Count 06/10/2016 8.5  4.0 - 10.5 THOUS/MCL Final    RBC 06/10/2016 3.62* 3.70 - 5.00 MILL/MCL Final    Hgb 06/10/2016 10.7* 11.5 - 15.0 G/DL Final    Hematocrit 62/13/086502/21/2018 32.1* 34.0 - 44.0 % Final    MCV 06/10/2016 88.5  81.5 - 97.0 FL Final    MCH 06/10/2016 29.5  27.0 - 33.5 PG Final    MCHC 06/10/2016  33.3  32.0 - 35.5 G/DL Final    RDW-CV 59/56/3875 13.6  11.6 - 14.4 % Final    PLT Count 06/10/2016 165  150 - 400 THOUS/MCL Final    MPV 06/10/2016 9.0  7.2 - 11.7 FL Final           IMAGING: none    Current Inpatient Medications:   docusate sodium  250 mg BID    ferrous sulfate  325 mg Daily    ibuprofen  600 mg Q6H    measles-mumps-rubella vaccine  0.5 mL Prior to discharge    prenatal vitamin-folic acid  1 tablet Daily    senna  8.6 mg BID    simethicone  80 mg TID      dextrose 10%        carboprost  250 mcg Once PRN    dextrose 10%  50 mL/hr Continuous PRN    dextrose  25 mL PRN    dextrose  50 mL Q15 Min PRN    dextrose  12.5 mL Q15 Min PRN    diphenhydrAMINE  25 mg Nightly PRN    Or    diphenhydrAMINE  25 mg Nightly PRN    Glucagon HCl (Diagnostic)  1 mg Q15 Min PRN    HYDROcodone-acetaminophen  1 tablet Q4H PRN    HYDROcodone-acetaminophen  2 tablet Q4H PRN    LAN-O-SOOTHE   PRN     methylergonovine  0.2 mg Once PRN    misoprostol  1,000 mcg Once PRN    ondansetron  4 mg Q4H PRN       Assessment/Plan:  31yo G2P2002 s/p RLTCS at 39 0/7wks, admitted for elective repeat c-section, breech presentation.    # 31yo G2P2002 s/p RLTCS at 39 0/7wks, admitted for elective repeat c-section, breech presentation. Meeting postpartum milestones. Continue routine postpartum care.  - advance diet as appropriate  - encourage ambulation  - voiding appropriately  - encourage breastfeeding  - hgb 12.2-->10.7    # Hx C/S x2    # Bicornuate uterus  [x]  renal US - mild R pelviectasis, otherwise wnl    # Rh negative - baby Rh neg    # Body mass index is 36.21 kg/(m^2).    # Circumcision: declines    # Contraception: fertility awareness/condoms    # Disposition: anticipate POD3-4    Discussed with Dr. Selena Batten, attending physician.    Erin Polo, MD MSc  [facility] PGY-1    For questions or concerns, please call the L&D R1 phone: [phone].\        ATTENDING     A/P:   1. POD 3- routine care. Pt desires to go home today. Instructions and precautions were reviewed.    2. BCM- condoms   3. F/u at clinic in 2 and 6 wks wks.   4. Post op hct 32., which is an appropriate decrease.   5. Rh neg --> baby also rh neg    The patient was seen and examined at 0555.  This patient was seen, evaluated, and care plan was developed with Dr. Myrtie Soman . I have reviewed the patients medical history, the residents physical examination findings, the patients diagnosis, and the treatment plan was developed together.  I agree with findings and plan of care as document by the resident.  I have included my revisions in the documentation.      Electronically signed:   Lajuana Matte M.D.   Ob/Gyn Attending

## 2016-06-12 NOTE — Plan of Care (Signed)
Problem: Promotion of Health and Safety of the Perinatal Patient  Goal: Promotion of Health and Safety of the Perinatal Patient  OB Patient safety and health maintained and promoted throughout her perinatal experience without compromise and/or demonstrates improvement in physical, psychoscocial and/or functional status in maternal or fetal/infant well-being from admission to discharge. Patient and/or family demonstrates alleviation of physical, emotional and/or spiritual suffering while facing perinatal loss from admission to discharge   Outcome: Adequate for discharge.

## 2016-06-12 NOTE — Plan of Care (Signed)
Problem: Promotion of Health and Safety of the Perinatal Patient  Goal: Promotion of Health and Safety of the Perinatal Patient  OB Patient safety and health maintained and promoted throughout her perinatal experience without compromise and/or demonstrates improvement in physical, psychoscocial and/or functional status in maternal or fetal/infant well-being from admission to discharge. Patient and/or family demonstrates alleviation of physical, emotional and/or spiritual suffering while facing perinatal loss from admission to discharge   Outcome: Adequate for discharge.      Comments:   Nursing Shift Summary    No data found.    No data found.      Rounding for the past 12 hrs:   Physician Rounding   06/12/16 1000 My nursing colleague or I joined the physician during  his/her interaction/rounds with the patient at least once during my shift       Shift Comments for the past 12 hrs:   Comments   06/12/16 0830 x1 norco for pain, 4/10   06/12/16 1000 [facility]   06/12/16 1236 Pt discharged to home. Escorted to vehicle via Agricultural consultantvolunteer. Pt stable on DC and given scheduled Motrin prior to leaving. Pt state a clear undersanding of DC instructions for both self and infant.

## 2016-06-12 NOTE — Plan of Care (Signed)
Problem: Promotion of Health and Safety of the Perinatal Patient  Goal: Promotion of Health and Safety of the Perinatal Patient  OB Patient safety and health maintained and promoted throughout her perinatal experience without compromise and/or demonstrates improvement in physical, psychoscocial and/or functional status in maternal or fetal/infant well-being from admission to discharge. Patient and/or family demonstrates alleviation of physical, emotional and/or spiritual suffering while facing perinatal loss from admission to discharge   Outcome: Progressing toward goal, anticipate improvement over: next 12-24 hours   06/10/16 0637 06/11/16 0900   Perinatal Plan of Care   Standard of Care/Policy --  Postpartum SOC   Outcome Evaluation (rationale for progressing/not progessing) Pain management --    Patient Specific Goal, Perinatal Patient --  Pt will be able to deep latch baby and stay wt exclusive breastfeeding.   Additional Individualized Interventions/Recommendations Encourage ambulation --    Additional Individualized Interventions/Recommendations Encourage fluid intake --

## 2016-06-12 NOTE — Discharge Instructions (Signed)
Patient to be given [facility] information. The clinic is located at [address], [place] [id number]. Phone: [phone]. Call for a 2 week postoperative check.    Please call to make an apt [facility], [phone]. You will need a 6 week postpartum appointment.      Postpartum Care After Cesarean Delivery  The period of time right after you deliver your newborn is called the postpartum period.  WHAT KIND OF MEDICAL CARE WILL I RECEIVE?   You may continue to receive fluids and medicines through an IV tube inserted into one of your veins.   You may have small, flexible tube (catheter) draining urine from your bladder into a bag outside of your body. The catheter will be removed as soon as possible.   You may be given a squirt bottle to use when you go to the bathroom. You may use this until you are comfortable wiping as usual. To use the squirt bottle, follow these steps:    Before you urinate, fill the squirt bottle with warm water. The water should be warm. Do not use hot water.    After you urinate, while you are sitting on the toilet, use the squirt bottle to rinse the area around your urethra and vaginal opening. This rinses away any urine and blood.    You may do this instead of wiping. As you start healing, you may use the squirt bottle before wiping yourself. Make sure to wipe gently.    Fill the squirt bottle with clean water every time you use the bathroom.   You will be given sanitary pads to wear.   Your incision will be monitored to make sure it is healing properly. You will be told when it is safe for your stitches, staples, or skin adhesive tape to be removed.  WHAT CAN I EXPECT?   You may not feel the need to urinate for several hours after delivery.   You will have some soreness and pain in your abdomen. You may have a small amount of blood or clear fluid coming from your incision.   If you are breastfeeding, you may have  uterine contractions every time you breastfeed for up to several weeks postpartum. Uterine contractions help your uterus return to its normal size.   It is normal to have vaginal bleeding (lochia) after delivery. The amount and appearance of lochia is often similar to a menstrual period in the first week after delivery. It will gradually decrease over the next few weeks to a dry, yellow-brown discharge. For most women, lochia stops completely by 6-8 weeks after delivery. Vaginal bleeding can vary from woman to woman.   Within the first few days after delivery, you may have breast engorgement. This is when your breasts feel heavy, full, and uncomfortable. Your breasts may also throb and feel hard, tightly stretched, warm, and tender. After this occurs, you may have milk leaking from your breasts.Your health care provider can help you relieve discomfort due to breast engorgement. Breast engorgement should go away within a few days.   You may feel more sad or worried than normal due to hormonal changes after delivery. These feelings should not last more than a few days. If these feelings do not go away after several days, speak with your health care provider.  HOW SHOULD I CARE FOR MYSELF?   Tell your health care provider if you have pain or discomfort.  Drink enough water to keep your urine clear or pale yellow.   Wash your hands thoroughly with soap and water for at least 20 seconds after changing your sanitary pads or using the toilet, and before holding or feeding your baby.   If you are not breastfeeding, avoid touching your breasts a lot. Doing this can make your breasts produce more milk.   If you become weak or lightheaded, or you feel like you might faint, ask for help before:    Getting out of bed.    Showering.   Change your sanitary pads frequently. Watch for any changes in your flow, such as a sudden increase in volume, a change in color, or the passing of large blood clots. If you pass a blood  clot from your vagina, save it to show to your health care provider. Do not flush blood clots down the toilet without having your health care provider look at them.   Make sure that all your vaccinations are up to date. This can help protect you and your baby from getting certain diseases. You may need to have immunizations done before you leave the hospital.   If desired, talk with your health care provider about methods of family planning or birth control (contraception).  HOW CAN I START BONDING WITH MY BABY?  Spending as much time as possible with your baby is very important. During this time, you and your baby can get to know each other and develop a bond. Having your baby stay with you in your room (rooming in) can give you time to get to know your baby. Rooming in can also help you become comfortable caring for your baby. Breastfeeding can also help you bond with your baby.  HOW CAN I PLAN FOR RETURNING HOME WITH MY BABY?   Make sure that you have a car seat installed in your vehicle.    Your car seat should be checked by a certified car seat installer to make sure that it is installed safely.    Make sure that your baby fits into the car seat safely.   Ask your health care provider any questions you have about caring for yourself or your baby. Make sure that you are able to contact your health care provider with any questions after leaving the hospital.     This information is not intended to replace advice given to you by your health care provider. Make sure you discuss any questions you have with your health care provider.     Document Released: 12/30/2011 Document Revised: 07/29/2015 Document Reviewed: 03/11/2015  Elsevier Interactive Patient Education 2017 [employer].

## 2016-06-15 ENCOUNTER — Telehealth: Payer: Self-pay | Admitting: Ob/Gyn

## 2016-06-15 NOTE — Telephone Encounter (Signed)
Patient requesting a call back from office, she states [facility] received [facility] but would like to be seen by Dr. Kelby FamMajor since she was seeing her for OB care. To please call her back for assistance, baby was born [birth year] C-Section. Please call her back for assistance. Thank you

## 2016-06-15 NOTE — Telephone Encounter (Signed)
PATIENT SCHEDULE WITH NINA FOR 2WK AND 6WK PPV

## 2016-06-22 ENCOUNTER — Telehealth: Payer: Self-pay | Admitting: Ob/Gyn

## 2016-06-22 ENCOUNTER — Ambulatory Visit (INDEPENDENT_AMBULATORY_CARE_PROVIDER_SITE_OTHER): Payer: Commercial Managed Care - PPO

## 2016-06-22 DIAGNOSIS — Z98891 History of uterine scar from previous surgery: Secondary | ICD-10-CM

## 2016-06-22 NOTE — Telephone Encounter (Signed)
SPOKE WITH PATIENT PER MARY SCHEDULED HER A NURSE VISIT FOR 06/23/16. THANK YOU

## 2016-06-22 NOTE — Progress Notes (Signed)
POSTPARTUM NOTE    Erin Harrington is a 31 year old year old G2P2002 delivered at 7743w0d by rLTCS on 06/09/16 for breech. She is here for 2 week follow up.  Postpartum course uncomplicated. 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     She reports vaginal bleeding after delivery for only 4 days. But the bleeding has increased and has been heavy with clots. She is soaking pads, but the bleeding would eventually slow down. Currently bleeding is not heavy. Incision with mild pain relieved with medication. No fever or chills. No bleeding, pus, or warmth around incision. Denies postpartum depression.  Breastfeeding well.      Physical Exam:    BP 112/80  Pulse 80  Temp 98.1 F (36.7 C)  Wt 93.4 kg (206 lb)  LMP 09/15/2015  BMI 33.25 kg/m2  Abdominal: Soft. Normal appearance. There is no tenderness.   Pfannenstiel incision well-healing without surrounding erythema, induration, warmth, or drainage. Nontender. Left side superior to incision slightly swollen, however soft.    Edinburgh: 6    Assessment/Plan:    - Steri strips removed. Healing well. Continue to monitor left side of incision for swelling or worsening symptoms. No signs of infection at this time.  - Breastfeeding support provided.  - Bleeding and infection precautions were given. If soaking 1 pad per hour for 2-3 hours, call or go to hospital.   - RTC 4 weeks for postpartum visit    Erin PapaNina Pung, NP  Supervising Physician: Dr. Kelby FamMajor

## 2016-06-22 NOTE — Telephone Encounter (Signed)
Patient is calling returning a call back from a nurse regarding scheduling her appt for a urine sample. Called office, and caller was transferred over for assistance, thank you.

## 2016-06-23 ENCOUNTER — Ambulatory Visit (INDEPENDENT_AMBULATORY_CARE_PROVIDER_SITE_OTHER): Payer: Commercial Managed Care - PPO

## 2016-06-23 DIAGNOSIS — N390 Urinary tract infection, site not specified: Principal | ICD-10-CM

## 2016-06-23 LAB — URINALYSIS
Bilirubin, UA: NEGATIVE
Glucose, UA: NEGATIVE MG/DL
Ketones, UA: NEGATIVE MG/DL
Leukocyte Esterase, UA: NEGATIVE
Nitrite, UA: NEGATIVE
Protein, UA: NEGATIVE MG/DL
Specific Grav, UA: 1.004 (ref 1.003–1.030)
Squamous Epithelial, UA: <1 /HPF (ref 0–10)
Urobilinogen, UA: <2 MG/DL (ref <2.0)
pH, UA: 7 (ref 5.0–8.0)

## 2016-06-23 LAB — URINALYSIS 8, POINT OF CARE TESTING SOM (LIO)
Glucose, UA POCT: NEGATIVE mg/dL
Ketone, UA POCT: NEGATIVE mg/dL
Leukocytes, UA POCT: NEGATIVE
Nitrite, UA POCT: NEGATIVE
Protein, UA POCT: NEGATIVE mg/dL
Specific Gravity, UA (POCT): <=1.005 (ref 1.003–1.030)
pH, UA POCT: 6 (ref 5.0–8.0)

## 2016-06-23 NOTE — Interdisciplinary (Signed)
Patient came into clinic to have urine specimen collected, due to sx's of UTI eg.. Burning during urination. Clean catch specimen collected and dipped in clinic. Specimen will be sent out to lab for further processing. Will monitor results

## 2016-06-24 LAB — URINE CULTURE

## 2016-07-20 ENCOUNTER — Ambulatory Visit (INDEPENDENT_AMBULATORY_CARE_PROVIDER_SITE_OTHER): Payer: Commercial Managed Care - PPO

## 2016-07-20 DIAGNOSIS — Z98891 History of uterine scar from previous surgery: Secondary | ICD-10-CM

## 2016-07-20 DIAGNOSIS — Z3009 Encounter for other general counseling and advice on contraception: Secondary | ICD-10-CM

## 2016-07-20 NOTE — Progress Notes (Signed)
POSTPARTUM NOTE    Erin Harrington is a 31 year old year old G2P2002 delivered at [redacted]w[redacted]d by rLTCS on [birth year]/09/16 for breech. She is here for 6 week postpartum visit.  Postpartum course uncomplicated. 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     She reports weight gain of 5 pounds in 1 week. Denies noticeable swelling. No increase in dietary salt, however she doesn't pay attention to how much salt is in her diet. Denies chest pain or SOB.     Denies fever, chills, or abdominal pain. Denies abnormal vaginal bleeding, discharge, or urinary symptoms.  No report of postpartum depression. Breast feeding without problems. No signs of mastitis.  Desires to use condoms for birth control.    Last pap: 2 years ago, normal per patient     Physical Exam:    BP 120/81  Pulse 73  Temp 97.6 F (36.4 C)  Wt 96 kg (211 lb 10.3 oz)  LMP 09/15/2015  Breastfeeding? Yes  BMI 34.16 kg/m2  Abdominal: Soft. Normal appearance. There is no tenderness.   Pfannenstiel incision well-healing without surrounding erythema, induration, warmth, or drainage. Nontender.  GYN: Vagina normal and uterus normal. Cervix exhibits no motion tenderness. Adnexa non-tender, no masses. Rectum normal, no external hemorrhoids.    Edinburgh: 5    Assessment/Plan:  Erin Harrington is a 31 year old year old G2P2002 delivered at [redacted]w[redacted]d by rLTCS on [birth year]/09/[birth year]. She is here for 6 week postpartum visit    1. Postpartum  - Incision healed  - May resume normal activity, discussed healthy diet and physical activity  - 5 lb weight gain in 1 week. Advised to keep weight log. If increasing, notify MD. Possible concerns for fluid overload. Decrease dietary salt and increase fluids.    2. Contraception counseling  - Discussed birth control options: condoms, pill, depo, Nexplanon, and IUD.   - Desires to use condoms.     3. Breastfeeding  - Discussed AAP recommendations to exclusively breastfeeding for at least 6 months.  - Educated  about signs and symptoms of mastitis.  - May continue prenatal vitamins    RTC as needed.    Renita Papa, NP  Supervising Physician: Dr. Kelby Fam

## 2016-11-19 ENCOUNTER — Telehealth: Payer: Self-pay | Admitting: Ob/Gyn

## 2016-11-19 NOTE — Telephone Encounter (Signed)
Patient states she has a UTI or possible yeast infection and would like to come in today or tomorrow to see somebody. Called office, Estefania took call for assistance. Thank you

## 2016-11-19 NOTE — Telephone Encounter (Signed)
SPOKE TO PATIENT NOTIFIED NO SOON APPTS AVAILABLE FOR GYN SHOULD BE SEEN WITH PCP OR URGENT CARE TO GET TREATED

## 2017-04-04 IMAGING — US US TRANSVAGINAL NON-OB
1 series · 13 of 25 positions shown · non-contrast
Comparison: 08/25/2007

CLINICAL DATA: LEFT lower quadrant pain for 4 days

EXAM:
TRANSABDOMINAL AND TRANSVAGINAL ULTRASOUND OF PELVIS
DOPPLER ULTRASOUND OF OVARIES
TECHNIQUE: Both transabdominal and transvaginal ultrasound examinations of the
pelvis were performed. Transabdominal technique was performed for
global imaging of the pelvis including uterus, ovaries, adnexal
regions, and pelvic cul-de-sac.
It was necessary to proceed with endovaginal exam following the
transabdominal exam to visualize the endometrium and ovaries, as
well as to evaluate blood flow within the ovaries. Color and duplex
Doppler ultrasound was utilized to evaluate blood flow to the
ovaries.

[Series 1: us transvaginal non-ob · 0.24mm/px · 13 of 79 slices shown]
[im 1/79]
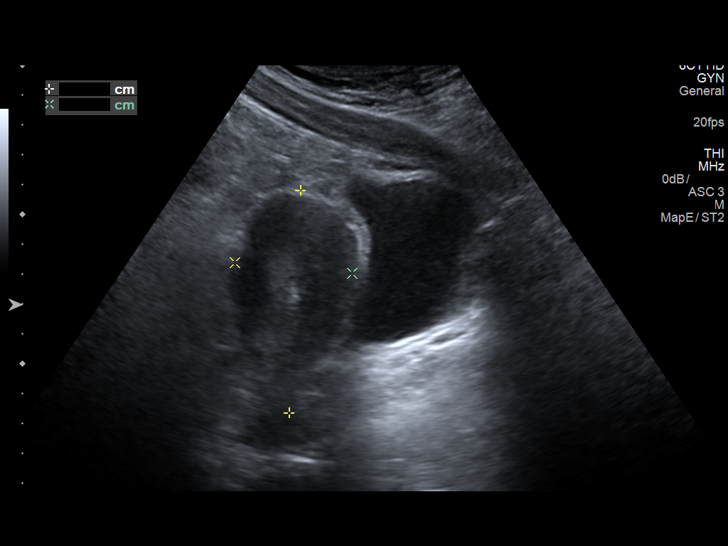
[im 7/79]
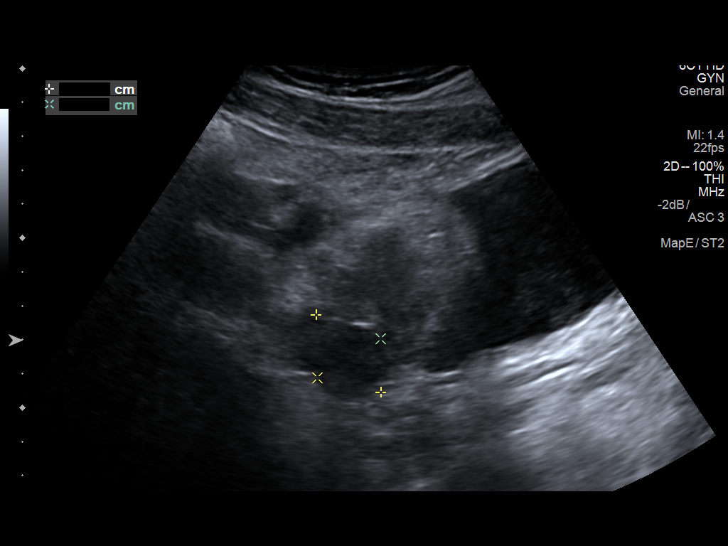
[im 14/79]
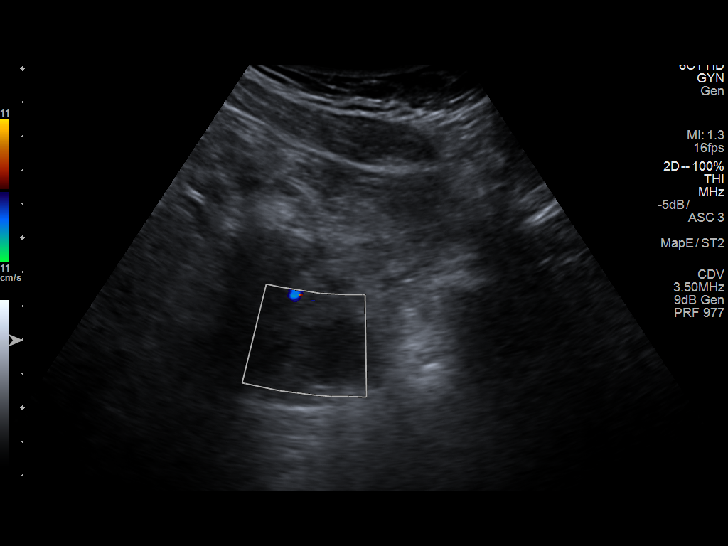
[im 20/79]
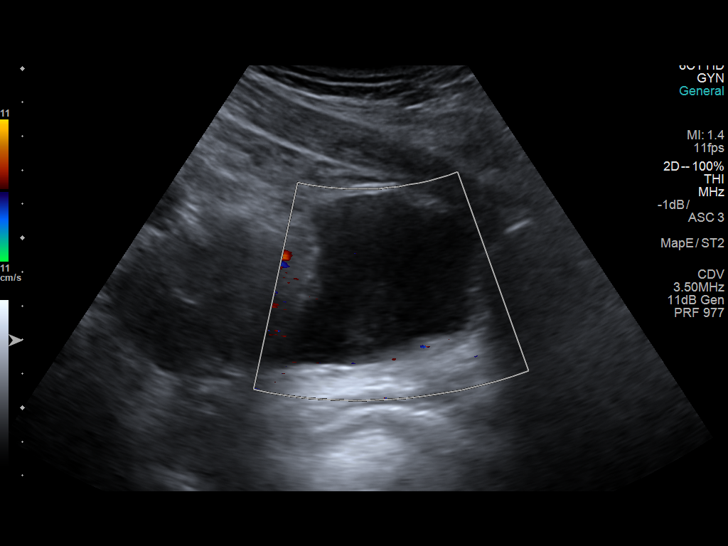
[im 27/79]
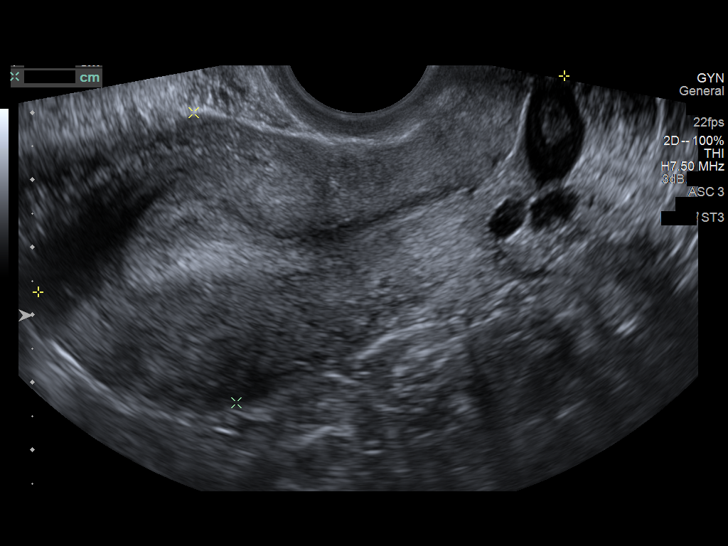
[im 33/79]
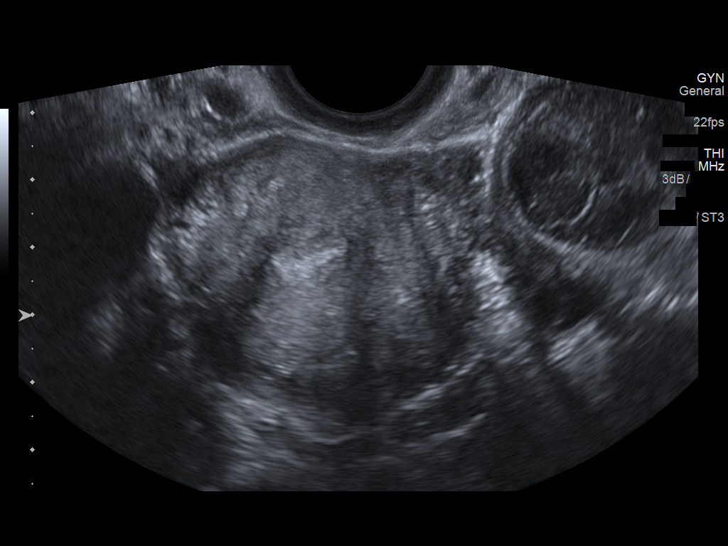
[im 40/79]
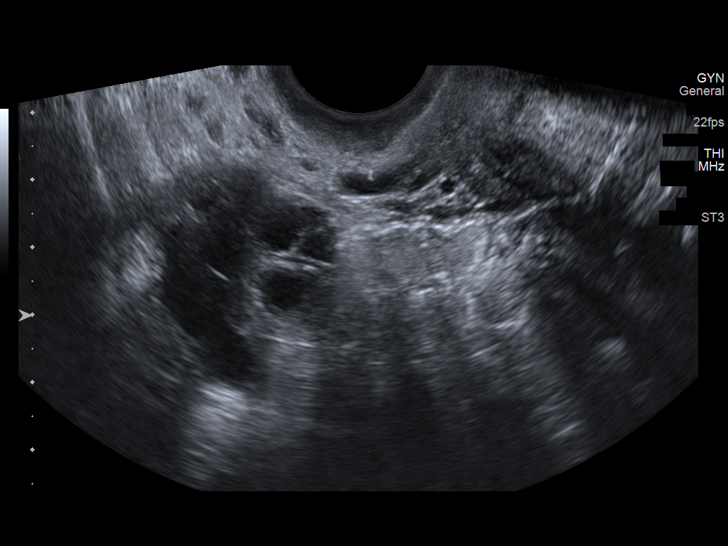
[im 46/79]
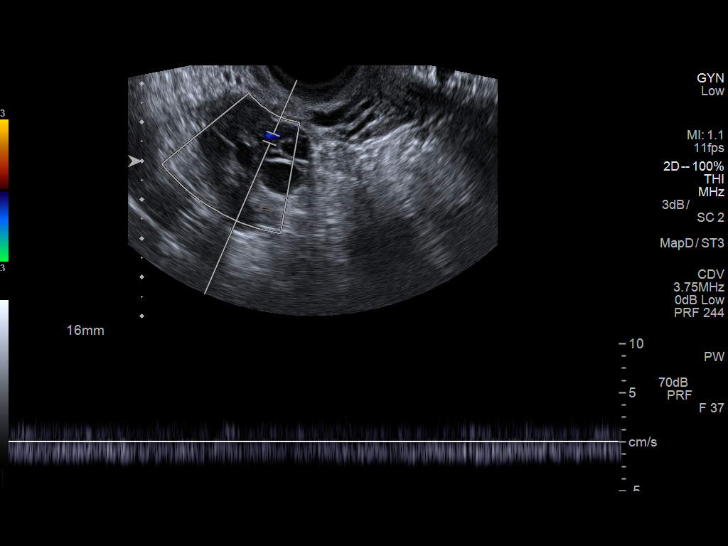
[im 53/79]
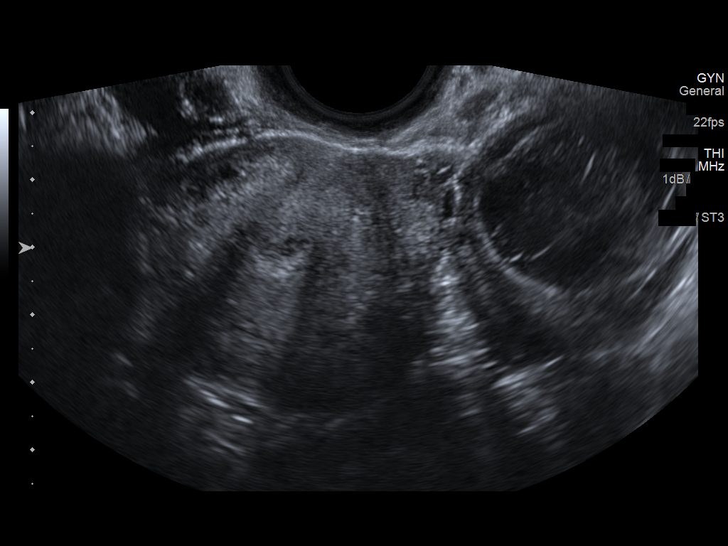
[im 59/79]
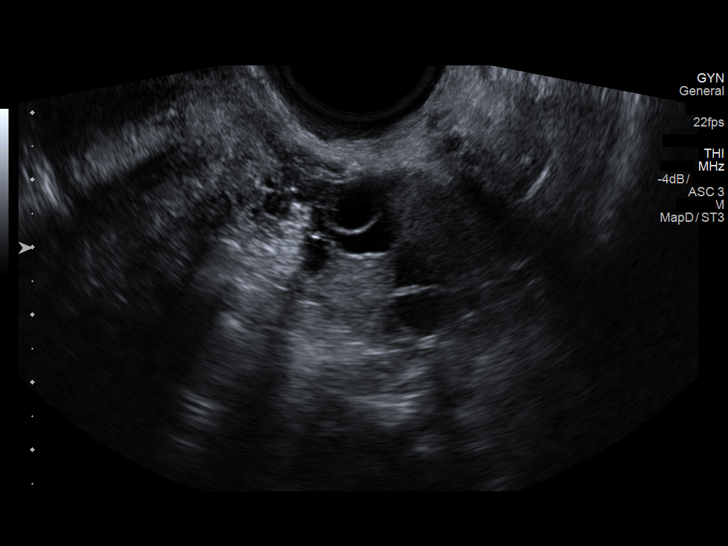
[im 66/79]
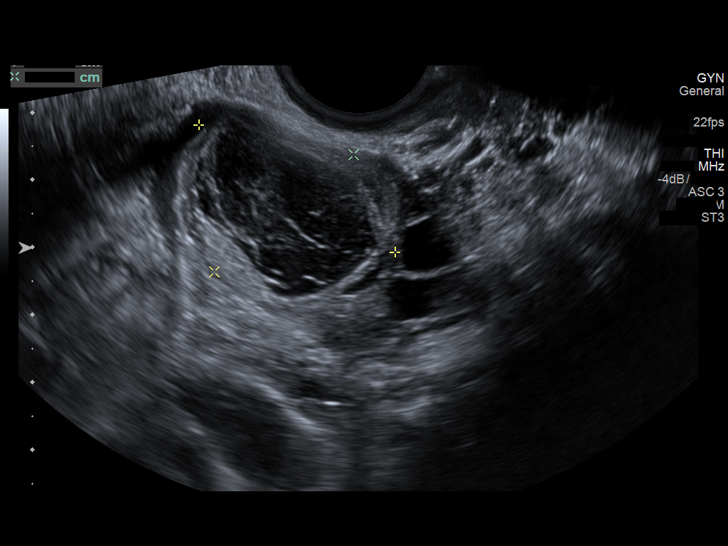
[im 72/79]
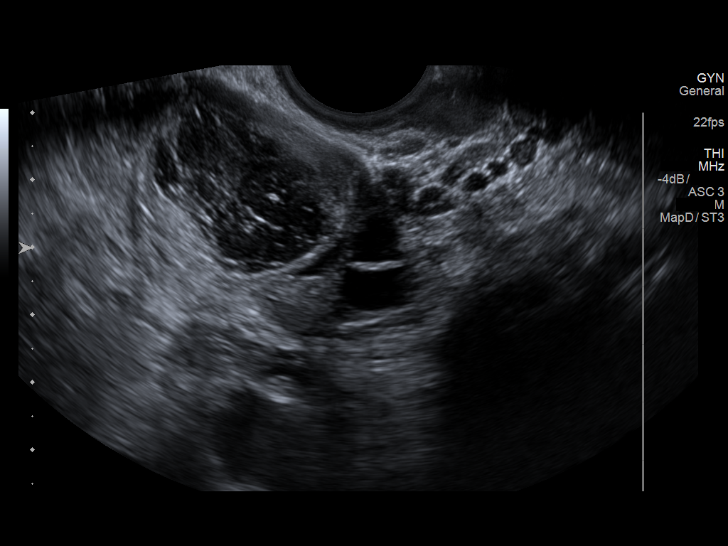
[im 79/79]
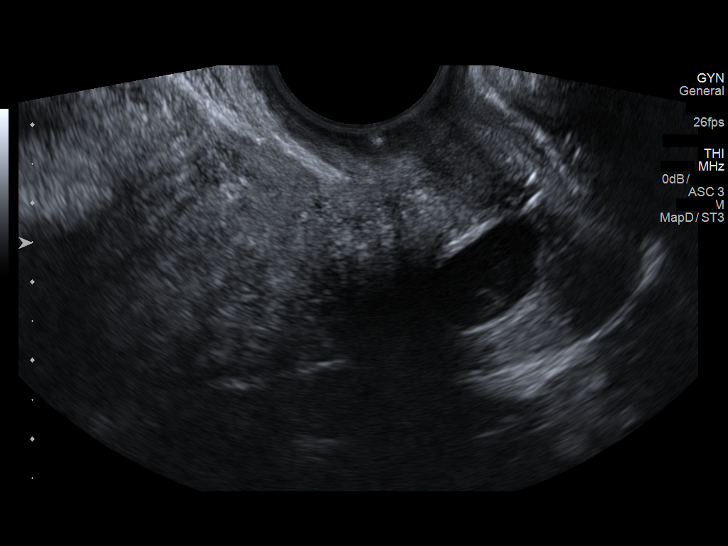

[13 of 25 positions shown; findings below may reference images not displayed]

FINDINGS: Uterus

Measurements: 8.4 x 4.3 x 4.1 cm. Normal morphology without mass

Endometrium

Thickness: 13 mm thick, normal. No endometrial fluid or focal
abnormality

Right ovary

Measurements: 3.9 x 2.9 x 3.0 cm. Normal morphology without mass.
Internal blood flow present on color Doppler imaging.

Left ovary

Measurements: 5.1 x 3.9 x 4.6 cm. Complex hypoechoic nodule
compatible with hemorrhagic cyst 3.5 x 2.7 x 3.4 cm. Blood flow
present within LEFT ovary on color Doppler imaging.

Pulsed Doppler evaluation of both ovaries demonstrates low
resistance arterial and venous waveforms within both ovaries.

Other findings

No free pelvic fluid. No additional adnexal masses. Small amount of
debris dependently in urinary bladder.
IMPRESSION: Hemorrhagic cyst LEFT ovary 3.5 x 2.7 x 3.4 cm.

No evidence of ovarian torsion.
